# Patient Record
Sex: Female | Born: 1980 | Race: White | Hispanic: No | Marital: Married | Smoking: Current some day smoker
Health system: Southern US, Community
[De-identification: ages and names within clinical notes are randomized; demographics above are authoritative.]

## PROBLEM LIST (undated history)

## (undated) DIAGNOSIS — M7730 Calcaneal spur, unspecified foot: Secondary | ICD-10-CM

## (undated) DIAGNOSIS — M722 Plantar fascial fibromatosis: Secondary | ICD-10-CM

## (undated) HISTORY — PX: WRIST SURGERY: SHX841

## (undated) HISTORY — DX: Plantar fascial fibromatosis: M72.2

## (undated) HISTORY — DX: Calcaneal spur, unspecified foot: M77.30

---

## 2012-08-19 DIAGNOSIS — Z0000 Encounter for general adult medical examination without abnormal findings: Secondary | ICD-10-CM | POA: Insufficient documentation

## 2012-08-19 DIAGNOSIS — S46811A Strain of other muscles, fascia and tendons at shoulder and upper arm level, right arm, initial encounter: Secondary | ICD-10-CM | POA: Insufficient documentation

## 2015-05-12 ENCOUNTER — Telehealth: Payer: Self-pay | Admitting: Family Medicine

## 2015-05-12 NOTE — Telephone Encounter (Signed)
appt given per patients request 

## 2015-06-18 ENCOUNTER — Encounter: Payer: Self-pay | Admitting: Physician Assistant

## 2015-06-18 ENCOUNTER — Ambulatory Visit (INDEPENDENT_AMBULATORY_CARE_PROVIDER_SITE_OTHER): Payer: BLUE CROSS/BLUE SHIELD | Admitting: Physician Assistant

## 2015-06-18 VITALS — BP 113/78 | HR 77 | Temp 98.0°F | Ht 63.5 in | Wt 182.6 lb

## 2015-06-18 DIAGNOSIS — Z0000 Encounter for general adult medical examination without abnormal findings: Secondary | ICD-10-CM

## 2015-06-18 DIAGNOSIS — E669 Obesity, unspecified: Secondary | ICD-10-CM

## 2015-06-18 DIAGNOSIS — Z01419 Encounter for gynecological examination (general) (routine) without abnormal findings: Secondary | ICD-10-CM

## 2015-06-18 LAB — POCT UA - MICROSCOPIC ONLY
BACTERIA, U MICROSCOPIC: NEGATIVE
CASTS, UR, LPF, POC: NEGATIVE
CRYSTALS, UR, HPF, POC: NEGATIVE
RBC, urine, microscopic: NEGATIVE
YEAST UA: NEGATIVE

## 2015-06-18 LAB — POCT URINALYSIS DIPSTICK
Bilirubin, UA: NEGATIVE
Blood, UA: NEGATIVE
Glucose, UA: NEGATIVE
Ketones, UA: NEGATIVE
Leukocytes, UA: NEGATIVE
Nitrite, UA: NEGATIVE
Protein, UA: NEGATIVE
Spec Grav, UA: 1.015
Urobilinogen, UA: NEGATIVE
pH, UA: 6

## 2015-06-18 LAB — POCT CBC
Granulocyte percent: 65.6 %G (ref 37–80)
HCT, POC: 40.8 % (ref 37.7–47.9)
Hemoglobin: 13 g/dL (ref 12.2–16.2)
Lymph, poc: 2.6 (ref 0.6–3.4)
MCH, POC: 26.7 pg — AB (ref 27–31.2)
MCHC: 31.9 g/dL (ref 31.8–35.4)
MCV: 83.6 fL (ref 80–97)
MPV: 8.5 fL (ref 0–99.8)
POC Granulocyte: 6 (ref 2–6.9)
POC LYMPH PERCENT: 28.4 %L (ref 10–50)
Platelet Count, POC: 202 10*3/uL (ref 142–424)
RBC: 4.88 M/uL (ref 4.04–5.48)
RDW, POC: 12.8 %
WBC: 9.2 10*3/uL (ref 4.6–10.2)

## 2015-06-18 MED ORDER — PHENTERMINE HCL 37.5 MG PO CAPS: ORAL_CAPSULE | ORAL | Status: DC

## 2015-06-18 NOTE — Progress Notes (Signed)
   Subjective:    Patient ID: Laura Bowers, female    DOB: [birth year], 34 y.o.   MRN: [id number]  HPI 34 y/o female presents for establishment of care. She is utd on all of her vaccinations and tetanus. She request pap today and consult on weight loss. Last pap was 2 years ago. No h/o cervical dysplasia. No h/o stds  She also has questions regarding weight loss. She has tried phentermine in the past, which suppressed her appetite.     Review of Systems  Constitutional: Negative.   HENT: Negative.   Eyes: Negative.   Respiratory: Negative.   Cardiovascular: Negative.   Gastrointestinal: Negative.   Endocrine: Negative.   Genitourinary: Negative.   Musculoskeletal: Negative.   Skin: Negative.   Allergic/Immunologic: Negative.   Neurological: Negative.   Hematological: Negative.   Psychiatric/Behavioral: Negative.        Objective:   Physical Exam  Constitutional: She is oriented to person, place, and time. She appears well-developed and well-nourished. No distress.  Cardiovascular: Normal rate, regular rhythm, normal heart sounds and intact distal pulses.  Exam reveals no gallop and no friction rub.   No murmur heard. Pulmonary/Chest: Effort normal and breath sounds normal. No respiratory distress. She has no wheezes. She has no rales. She exhibits no tenderness.  Genitourinary: Vaginal discharge found.  Musculoskeletal: Normal range of motion. She exhibits no edema.  Neurological: She is alert and oriented to person, place, and time.  Skin: She is not diaphoretic.  Psychiatric: She has a normal mood and affect. Her behavior is normal. Judgment and thought content normal.  Nursing note and vitals reviewed.         Assessment & Plan:  1. Annual physical exam  - POCT UA - Microscopic Only - POCT urinalysis dipstick - POCT CBC - CMP14+EGFR - Vit D  25 hydroxy (rtn osteoporosis monitoring) - Thyroid Panel With TSH - Pap IG w/ reflex to HPV when ASC-U  2.  Obesity - Discussed consult with nutritionish  - phentermine 37.5 MG capsule; Take 1/2 to 1 tablet PO qam 30 min prior to breakfast  Dispense: 30 capsule; Refill: 2   Continue all meds Labs pending Health Maintenance reviewed Diet and exercise encouraged RTO 1 year   Cameran Pettey A. Benjamin Stain PA-C

## 2015-06-19 LAB — CMP14+EGFR
ALT: 11 IU/L (ref 0–32)
AST: 13 IU/L (ref 0–40)
Albumin/Globulin Ratio: 1.9 (ref 1.1–2.5)
Albumin: 4.3 g/dL (ref 3.5–5.5)
Alkaline Phosphatase: 50 IU/L (ref 39–117)
BUN/Creatinine Ratio: 19 (ref 8–20)
BUN: 13 mg/dL (ref 6–20)
Bilirubin Total: 0.5 mg/dL (ref 0.0–1.2)
CHLORIDE: 105 mmol/L (ref 97–108)
CO2: 24 mmol/L (ref 18–29)
CREATININE: 0.67 mg/dL (ref 0.57–1.00)
Calcium: 9.3 mg/dL (ref 8.7–10.2)
GFR calc Af Amer: 133 mL/min/{1.73_m2} (ref >59)
GFR calc non Af Amer: 115 mL/min/{1.73_m2} (ref >59)
GLOBULIN, TOTAL: 2.3 g/dL (ref 1.5–4.5)
Glucose: 75 mg/dL (ref 65–99)
Potassium: 4.4 mmol/L (ref 3.5–5.2)
Sodium: 143 mmol/L (ref 134–144)
Total Protein: 6.6 g/dL (ref 6.0–8.5)

## 2015-06-19 LAB — THYROID PANEL WITH TSH
Free Thyroxine Index: 2.5 (ref 1.2–4.9)
T3 Uptake Ratio: 32 % (ref 24–39)
T4 TOTAL: 7.7 ug/dL (ref 4.5–12.0)
TSH: 1.71 u[IU]/mL (ref 0.450–4.500)

## 2015-06-19 LAB — VITAMIN D 25 HYDROXY (VIT D DEFICIENCY, FRACTURES): VIT D 25 HYDROXY: 30.3 ng/mL (ref 30.0–100.0)

## 2015-06-22 LAB — PAP IG W/ RFLX HPV ASCU: PAP SMEAR COMMENT: 0

## 2015-06-23 ENCOUNTER — Telehealth: Payer: Self-pay | Admitting: *Deleted

## 2015-06-23 NOTE — Telephone Encounter (Signed)
-----   Message from Chelsea Primus, PA-C sent at 06/22/2015  3:37 PM EDT ----- All labs WNL with normal PAP. Repeat pap in 3 years Tiffany A. Chauncey Reading PA-C

## 2015-06-23 NOTE — Progress Notes (Signed)
Pt aware of lab pap results

## 2015-06-23 NOTE — Telephone Encounter (Signed)
lmtcb regarding test results. 

## 2016-11-24 ENCOUNTER — Encounter: Payer: Self-pay | Admitting: Family Medicine

## 2016-11-24 ENCOUNTER — Ambulatory Visit (INDEPENDENT_AMBULATORY_CARE_PROVIDER_SITE_OTHER): Payer: Managed Care, Other (non HMO) | Admitting: Family Medicine

## 2016-11-24 VITALS — BP 116/83 | HR 95 | Temp 98.4°F | Ht 63.5 in | Wt 177.0 lb

## 2016-11-24 DIAGNOSIS — M62838 Other muscle spasm: Secondary | ICD-10-CM

## 2016-11-24 DIAGNOSIS — F411 Generalized anxiety disorder: Secondary | ICD-10-CM

## 2016-11-24 MED ORDER — CYCLOBENZAPRINE HCL 10 MG PO TABS: 10.0000 mg | ORAL_TABLET | Freq: Three times a day (TID) | ORAL | 0 refills | Status: DC | PRN

## 2016-11-24 MED ORDER — ESCITALOPRAM OXALATE 10 MG PO TABS: 10.0000 mg | ORAL_TABLET | Freq: Every day | ORAL | 1 refills | Status: DC

## 2016-11-24 NOTE — Progress Notes (Signed)
BP 116/83   Pulse 95   Temp 98.4 F (36.9 C) (Oral)   Ht 5' 3.5" (1.613 m)   Wt 177 lb (80.3 kg)   LMP 11/20/2016   BMI 30.86 kg/m    Subjective:    Patient ID: Laura Bowers, female    DOB: 05-31-1981, 36 y.o.   MRN: [id number]  HPI: Roxane Puerto is a 36 y.o. female presenting on 11/24/2016 for right shoulder pain (wakes up feeling anxious)   HPI Right shoulder pain and neck tension Patient has been having increasingly frequent right shoulder tension and neck tension that has been more prevalent over the past couple months. She denies any numbness or weakness or recent trauma or falls prior to any of this starting up but she just says she came to getting more and more tense. She has been trying some heat helped some and then it does help to rest or sleep. 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 When she is off work for a couple days she starts to feel better. She denies any pain radiating anywhere else but the muscles on her trapezius between her neck and her shoulders.  Anxiety Patient comes in with feelings of anxiety that are building up over the last a while and she feels like that is what is waking her up multiple times through the night. She also feels like that may be attributing to her tension that she is having. She denies any feelings of depression or sadness or suicidal ideations. She does admit that it has started affect her appetite and sometimes she will get so shaky with anxiety that she will have tremors in her hands and palpitations and flutters. She does admit that she has always been a worrier and slightly more anxious than others around her. Most recently she has had a lot of difficulty because of the holiday break and family members that she's been missing.  Relevant past medical, surgical, family and social history reviewed  and updated as indicated. Interim medical history since our last visit reviewed. Allergies and medications reviewed and updated.  Review of Systems  Constitutional: Negative for chills and fever.  HENT: Negative for congestion, ear discharge and ear pain.   Eyes: Negative for redness and visual disturbance.  Respiratory: Negative for chest tightness and shortness of breath.   Cardiovascular: Negative for chest pain and leg swelling.  Genitourinary: Negative for difficulty urinating and dysuria.  Musculoskeletal: Positive for myalgias. Negative for back pain and gait problem.  Skin: Negative for rash.  Neurological: Negative for light-headedness and headaches.  Psychiatric/Behavioral: Positive for decreased concentration and sleep disturbance. Negative for agitation, behavioral problems, dysphoric mood, self-injury and suicidal ideas. The patient is nervous/anxious.   All other systems reviewed and are negative.   Per HPI unless specifically indicated above   Allergies as of 11/24/2016   No Known Allergies     Medication List       Accurate as of 11/24/16  4:24 PM. Always use your most recent med list.          cyclobenzaprine 10 MG tablet Commonly known as:  FLEXERIL Take 1 tablet (10 mg total) by mouth 3 (three) times daily as needed for muscle spasms.   escitalopram 10 MG tablet Commonly known as:  LEXAPRO Take 1 tablet (10 mg total)  by mouth daily.   phentermine 37.5 MG capsule Take 1/2 to 1 tablet PO qam 30 min prior to breakfast          Objective:    BP 116/83   Pulse 95   Temp 98.4 F (36.9 C) (Oral)   Ht 5' 3.5" (1.613 m)   Wt 177 lb (80.3 kg)   LMP 11/20/2016   BMI 30.86 kg/m   Wt Readings from Last 3 Encounters:  11/24/16 177 lb (80.3 kg)  06/18/15 182 lb 9.6 oz (82.8 kg)    Physical Exam  Constitutional: She is oriented to person, place, and time. She appears well-developed and well-nourished. No distress.  Eyes: Conjunctivae are normal.    Cardiovascular: Normal rate, regular rhythm, normal heart sounds and intact distal pulses.   No murmur heard. Pulmonary/Chest: Effort normal and breath sounds normal. No respiratory distress. She has no wheezes. She has no rales.  Musculoskeletal: Normal range of motion. She exhibits no edema.       Cervical back: She exhibits tenderness (Tenderness and tightness over both of her trapezius muscles, worse on right. No numbness or weakness or loss of range of motion. No pain in her neck or midline bony tenderness) and spasm. She exhibits normal range of motion, no bony tenderness and normal pulse.  Neurological: She is alert and oriented to person, place, and time. Coordination normal.  Skin: Skin is warm and dry. No rash noted. She is not diaphoretic.  Psychiatric: Her behavior is normal. Judgment and thought content normal. Her mood appears anxious. She does not exhibit a depressed mood. She expresses no suicidal ideation. She expresses no suicidal plans.  Nursing note and vitals reviewed.     Assessment & Plan:   Problem List Items Addressed This Visit    None    Visit Diagnoses    Generalized anxiety disorder    -  Primary   Relevant Medications   escitalopram (LEXAPRO) 10 MG tablet   Trapezius muscle spasm       Relevant Medications   cyclobenzaprine (FLEXERIL) 10 MG tablet       Follow up plan: Return in about 4 weeks (around 12/22/2016), or if symptoms worsen or fail to improve, for Recheck anxiety .  Counseling provided for all of the vaccine components No orders of the defined types were placed in this encounter.   Arville CareJoshua Konstantine Gervasi, MD [facility] 11/24/2016, 4:24 PM

## 2016-12-22 ENCOUNTER — Ambulatory Visit (INDEPENDENT_AMBULATORY_CARE_PROVIDER_SITE_OTHER): Payer: Managed Care, Other (non HMO) | Admitting: Family Medicine

## 2016-12-22 ENCOUNTER — Encounter: Payer: Self-pay | Admitting: Family Medicine

## 2016-12-22 VITALS — BP 114/77 | HR 73 | Temp 97.0°F | Ht 63.0 in | Wt 173.0 lb

## 2016-12-22 DIAGNOSIS — M25511 Pain in right shoulder: Secondary | ICD-10-CM

## 2016-12-22 DIAGNOSIS — G5621 Lesion of ulnar nerve, right upper limb: Secondary | ICD-10-CM | POA: Diagnosis not present

## 2016-12-22 DIAGNOSIS — F411 Generalized anxiety disorder: Secondary | ICD-10-CM | POA: Diagnosis not present

## 2016-12-22 MED ORDER — ESCITALOPRAM OXALATE 10 MG PO TABS: 10.0000 mg | ORAL_TABLET | Freq: Every day | ORAL | 1 refills | Status: DC

## 2016-12-22 MED ORDER — PREDNISONE 20 MG PO TABS: ORAL_TABLET | ORAL | 0 refills | Status: DC

## 2016-12-22 NOTE — Progress Notes (Signed)
BP 114/77   Pulse 73   Temp 97 F (36.1 C) (Oral)   Ht 5\' 3"  (1.6 m)   Wt 173 lb (78.5 kg)   BMI 30.65 kg/m    Subjective:    Patient ID: Laura Bowers, female    DOB: 09/07/1981, 36 y.o.   MRN: [id number]  HPI: Laura Bushyatasha Bowers is a 36 y.o. female presenting on 12/22/2016 for 4 week recheck amxiety   HPI Anxiety Patient is coming in to have a 4 week recheck of anxiety. She is currently on Lexapro 10 mg. She has been doing well on the medication denies any major issues. She denies any overwhelming feelings of anxiety. She denies any suicidal ideations or thoughts of hurting herself. She really feels like the medication is doing great for her.  Right shoulder pain with nerve pain and numbness going down into her hands. The pain in the right arm and numbness going down has been intermittent but something that she has been dealing with for a little while. She has used Flexeril which has helped some. She feels like it has not helped completely get rid of it. She says the pain is worse with overhead range of motion.  Relevant past medical, surgical, family and social history reviewed and updated as indicated. Interim medical history since our last visit reviewed. Allergies and medications reviewed and updated.  Review of Systems  Constitutional: Negative for chills and fever.  Respiratory: Negative for chest tightness and shortness of breath.   Cardiovascular: Negative for chest pain and leg swelling.  Genitourinary: Negative for difficulty urinating and dysuria.  Musculoskeletal: Positive for arthralgias. Negative for back pain and gait problem.  Skin: Negative for rash.  Neurological: Positive for numbness. Negative for weakness, light-headedness and headaches.  Psychiatric/Behavioral: Negative for agitation, behavioral problems, dysphoric mood, self-injury, sleep disturbance and suicidal ideas. The patient is nervous/anxious.   All other systems reviewed and are negative.   Per  HPI unless specifically indicated above     Objective:    BP 114/77   Pulse 73   Temp 97 F (36.1 C) (Oral)   Ht 5\' 3"  (1.6 m)   Wt 173 lb (78.5 kg)   BMI 30.65 kg/m   Wt Readings from Last 3 Encounters:  12/22/16 173 lb (78.5 kg)  11/24/16 177 lb (80.3 kg)  06/18/15 182 lb 9.6 oz (82.8 kg)    Physical Exam  Constitutional: She is oriented to person, place, and time. She appears well-developed and well-nourished. No distress.  Eyes: Conjunctivae are normal.  Neck: Neck supple. No thyromegaly present.  Cardiovascular: Normal rate, regular rhythm, normal heart sounds and intact distal pulses.   No murmur heard. Pulmonary/Chest: Effort normal and breath sounds normal. No respiratory distress. She has no wheezes. She has no rales.  Musculoskeletal: Normal range of motion. She exhibits tenderness. She exhibits no edema.       Right shoulder: She exhibits tenderness (Lateral tenderness, pain is worse with overhead range of motion.). She exhibits normal range of motion, no bony tenderness, no swelling, no deformity, normal pulse and normal strength.  Lymphadenopathy:    She has no cervical adenopathy.  Neurological: She is alert and oriented to person, place, and time. A sensory deficit (Right medial hand numbness.) is present. Coordination normal.  Skin: Skin is warm and dry. No rash noted. She is not diaphoretic.  Psychiatric: She has a normal mood and affect. Her behavior is normal.  Nursing note and vitals reviewed.  Assessment & Plan:   Problem List Items Addressed This Visit      Other   Generalized anxiety disorder - Primary   Relevant Medications   escitalopram (LEXAPRO) 10 MG tablet    Other Visit Diagnoses    Acute pain of right shoulder       Relevant Medications   predniSONE (DELTASONE) 20 MG tablet   Ulnar neuropathy of right upper extremity       Relevant Medications   escitalopram (LEXAPRO) 10 MG tablet   predniSONE (DELTASONE) 20 MG tablet        Follow up plan: Return if symptoms worsen or fail to improve.  Counseling provided for all of the vaccine components No orders of the defined types were placed in this encounter.   Arville Care, MD [facility] 12/22/2016, 4:45 PM

## 2017-01-15 ENCOUNTER — Encounter: Payer: Self-pay | Admitting: Family Medicine

## 2017-01-15 ENCOUNTER — Ambulatory Visit (INDEPENDENT_AMBULATORY_CARE_PROVIDER_SITE_OTHER): Payer: Managed Care, Other (non HMO) | Admitting: Family Medicine

## 2017-01-15 ENCOUNTER — Ambulatory Visit (INDEPENDENT_AMBULATORY_CARE_PROVIDER_SITE_OTHER): Payer: Managed Care, Other (non HMO)

## 2017-01-15 VITALS — BP 95/67 | HR 74 | Temp 98.3°F | Ht 63.0 in | Wt 175.0 lb

## 2017-01-15 DIAGNOSIS — Z7722 Contact with and (suspected) exposure to environmental tobacco smoke (acute) (chronic): Secondary | ICD-10-CM | POA: Diagnosis not present

## 2017-01-15 DIAGNOSIS — Z114 Encounter for screening for human immunodeficiency virus [HIV]: Secondary | ICD-10-CM

## 2017-01-15 DIAGNOSIS — Z0000 Encounter for general adult medical examination without abnormal findings: Secondary | ICD-10-CM

## 2017-01-15 DIAGNOSIS — M25511 Pain in right shoulder: Secondary | ICD-10-CM

## 2017-01-15 NOTE — Progress Notes (Signed)
BP 95/67   Pulse 74   Temp 98.3 F (36.8 C) (Oral)   Ht '5\' 3"'$  (1.6 m)   Wt 175 lb (79.4 kg)   BMI 31.00 kg/m    Subjective:    Patient ID: Laura Bowers, female    DOB: Sep 15, 1981, 36 y.o.   MRN: [id number]  HPI: Laura Bowers is a 36 y.o. female presenting on 01/15/2017 for Annual Exam (patient is fasting; Flexeril is not helping, shoulder felt better while on Prednisone but started hurting again as soon as she completed.  Waiting on referral still)   HPI Adult well exam and fasting labs Patient is coming in today for well adult exam and fasting labs. He denies any chest pain, shortness of breath, headaches or vision issues, abdominal complaints, diarrhea, nausea, vomiting, or joint issues besides her shoulder that's been bothering her for some time. Patient grew up with smokers and then has continued to smoke on her own and is just concerned because lung cancer run in her family. She would like to get a chest x-ray to see if it will be covered and see if anything is noted on the chest x-ray.  Right shoulder pain continued Patient has had right shoulder pain is been continued. She did not get a call for the orthopedic referral to was put into [place]. She has had continued shoulder pain. The prednisone did help but she feels like the Flexeril is not. The pain is still on the lateral aspect of her right neck radiating down into her right shoulder. She denies any numbness or weakness. She is still working a job where she looks down most of the time.  Relevant past medical, surgical, family and social history reviewed and updated as indicated. Interim medical history since our last visit reviewed. Allergies and medications reviewed and updated.  Review of Systems  Constitutional: Negative for chills and fever.  Respiratory: Negative for chest tightness and shortness of breath.   Cardiovascular: Negative for chest pain and leg swelling.  Genitourinary: Negative for difficulty  urinating and dysuria.  Musculoskeletal: Positive for arthralgias. Negative for back pain and gait problem.  Skin: Negative for rash.  Neurological: Positive for numbness. Negative for weakness, light-headedness and headaches.  Psychiatric/Behavioral: Negative for agitation, behavioral problems, dysphoric mood, self-injury, sleep disturbance and suicidal ideas. The patient is nervous/anxious.   All other systems reviewed and are negative.   Per HPI unless specifically indicated above     Objective:    BP 95/67   Pulse 74   Temp 98.3 F (36.8 C) (Oral)   Ht '5\' 3"'$  (1.6 m)   Wt 175 lb (79.4 kg)   BMI 31.00 kg/m   Wt Readings from Last 3 Encounters:  01/15/17 175 lb (79.4 kg)  12/22/16 173 lb (78.5 kg)  11/24/16 177 lb (80.3 kg)    Physical Exam  Constitutional: She is oriented to person, place, and time. She appears well-developed and well-nourished. No distress.  Eyes: Conjunctivae are normal.  Neck: Neck supple. No thyromegaly present.  Cardiovascular: Normal rate, regular rhythm, normal heart sounds and intact distal pulses.   No murmur heard. Pulmonary/Chest: Effort normal and breath sounds normal. No respiratory distress. She has no wheezes. She has no rales.  Musculoskeletal: Normal range of motion. She exhibits tenderness. She exhibits no edema.       Right shoulder: She exhibits tenderness (Lateral tenderness, pain is worse with overhead range of motion.). She exhibits normal range of motion, no bony tenderness, no swelling, no  deformity, normal pulse and normal strength.  Lymphadenopathy:    She has no cervical adenopathy.  Neurological: She is alert and oriented to person, place, and time. A sensory deficit (Right medial hand numbness.) is present. Coordination normal.  Skin: Skin is warm and dry. No rash noted. She is not diaphoretic.  Psychiatric: She has a normal mood and affect. Her behavior is normal.  Nursing note and vitals reviewed.     Assessment & Plan:    Problem List Items Addressed This Visit    None    Visit Diagnoses    Well adult exam    -  Primary   Relevant Orders   CMP14+EGFR (Completed)   Lipid panel (Completed)   CBC with Differential/Platelet (Completed)   DG Chest 2 View (Completed)   Acute pain of right shoulder       Relevant Orders   Ambulatory referral to Orthopedic Surgery   Secondhand smoke exposure       Grew up with grandparents, 17 years of smoking exposure, will do screening chest x-ray   Relevant Orders   DG Chest 2 View (Completed)   Screening for HIV without presence of risk factors       Relevant Orders   HIV antibody (Completed)       Follow up plan: Return in about 6 months (around 07/15/2017), or if symptoms worsen or fail to improve, for Recheck on Lexapro.  Counseling provided for all of the vaccine components Orders Placed This Encounter  Procedures  . DG Chest 2 View  . CMP14+EGFR  . Lipid panel  . CBC with Differential/Platelet  . HIV antibody  . Ambulatory referral to [place] Dettinger, MD [facility] 01/15/2017, 11:19 AM

## 2017-01-17 LAB — CMP14+EGFR
ALBUMIN: 4.2 g/dL (ref 3.5–5.5)
ALK PHOS: 49 IU/L (ref 39–117)
ALT: 15 IU/L (ref 0–32)
AST: 15 IU/L (ref 0–40)
Albumin/Globulin Ratio: 1.6 (ref 1.2–2.2)
BUN / CREAT RATIO: 14 (ref 9–23)
BUN: 10 mg/dL (ref 6–20)
Bilirubin Total: 0.5 mg/dL (ref 0.0–1.2)
CHLORIDE: 101 mmol/L (ref 96–106)
CO2: 25 mmol/L (ref 18–29)
CREATININE: 0.69 mg/dL (ref 0.57–1.00)
Calcium: 9.1 mg/dL (ref 8.7–10.2)
GFR calc Af Amer: 130 mL/min/{1.73_m2} (ref >59)
GFR calc non Af Amer: 112 mL/min/{1.73_m2} (ref >59)
GLUCOSE: 77 mg/dL (ref 65–99)
Globulin, Total: 2.6 g/dL (ref 1.5–4.5)
Potassium: 4.3 mmol/L (ref 3.5–5.2)
SODIUM: 140 mmol/L (ref 134–144)
Total Protein: 6.8 g/dL (ref 6.0–8.5)

## 2017-01-17 LAB — CBC WITH DIFFERENTIAL/PLATELET
BASOS: 0 %
Basophils Absolute: 0 10*3/uL (ref 0.0–0.2)
EOS (ABSOLUTE): 0.1 10*3/uL (ref 0.0–0.4)
EOS: 2 %
Hematocrit: 39.3 % (ref 34.0–46.6)
Hemoglobin: 12.4 g/dL (ref 11.1–15.9)
IMMATURE GRANS (ABS): 0 10*3/uL (ref 0.0–0.1)
Immature Granulocytes: 0 %
Lymphocytes Absolute: 2.3 10*3/uL (ref 0.7–3.1)
Lymphs: 28 %
MCH: 28.6 pg (ref 26.6–33.0)
MCHC: 31.6 g/dL (ref 31.5–35.7)
MCV: 91 fL (ref 79–97)
MONOCYTES: 4 %
Monocytes Absolute: 0.4 10*3/uL (ref 0.1–0.9)
NEUTROS PCT: 66 %
Neutrophils Absolute: 5.3 10*3/uL (ref 1.4–7.0)
PLATELETS: 277 10*3/uL (ref 150–379)
RBC: 4.34 x10E6/uL (ref 3.77–5.28)
RDW: 13.8 % (ref 12.3–15.4)
WBC: 8.2 10*3/uL (ref 3.4–10.8)

## 2017-01-17 LAB — LIPID PANEL
CHOLESTEROL TOTAL: 140 mg/dL (ref 100–199)
Chol/HDL Ratio: 3.6 ratio units (ref 0.0–4.4)
HDL: 39 mg/dL — ABNORMAL LOW (ref >39)
LDL Calculated: 83 mg/dL (ref 0–99)
Triglycerides: 91 mg/dL (ref 0–149)
VLDL Cholesterol Cal: 18 mg/dL (ref 5–40)

## 2017-01-17 LAB — HIV ANTIBODY (ROUTINE TESTING W REFLEX): HIV SCREEN 4TH GENERATION: NONREACTIVE

## 2017-03-16 ENCOUNTER — Other Ambulatory Visit: Payer: Self-pay | Admitting: Family Medicine

## 2017-03-16 DIAGNOSIS — F411 Generalized anxiety disorder: Secondary | ICD-10-CM

## 2017-03-16 MED ORDER — ESCITALOPRAM OXALATE 10 MG PO TABS: 10.0000 mg | ORAL_TABLET | Freq: Every day | ORAL | 0 refills | Status: DC

## 2017-03-16 NOTE — Telephone Encounter (Signed)
What is the name of the medication? Lexapro  Have you contacted your pharmacy to request a refill? YES  Which pharmacy would you like this sent to? [facility] in [place]   Patient notified that their request is being sent to the clinical staff for review and that they should receive a call once it is complete. If they do not receive a call within 24 hours they can check with their pharmacy or our office.

## 2017-03-16 NOTE — Telephone Encounter (Signed)
done

## 2017-03-22 ENCOUNTER — Ambulatory Visit: Payer: Managed Care, Other (non HMO) | Admitting: Family Medicine

## 2017-03-23 ENCOUNTER — Telehealth: Payer: Self-pay | Admitting: Family Medicine

## 2017-03-23 ENCOUNTER — Encounter: Payer: Self-pay | Admitting: Family Medicine

## 2017-03-29 NOTE — Telephone Encounter (Signed)
Voicemail full jkp 5/10

## 2017-06-21 ENCOUNTER — Encounter: Payer: Self-pay | Admitting: Family

## 2017-06-21 ENCOUNTER — Ambulatory Visit (INDEPENDENT_AMBULATORY_CARE_PROVIDER_SITE_OTHER): Payer: Managed Care, Other (non HMO) | Admitting: Family

## 2017-06-21 VITALS — BP 108/71 | HR 73 | Temp 97.2°F | Ht 63.0 in | Wt 178.8 lb

## 2017-06-21 DIAGNOSIS — M25511 Pain in right shoulder: Secondary | ICD-10-CM

## 2017-06-21 NOTE — Patient Instructions (Signed)
Shoulder Pain Many things can cause shoulder pain, including:  An injury to the area.  Overuse of the shoulder.  Arthritis. The source of the pain can be:  Inflammation.  An injury to the shoulder joint.  An injury to a tendon, ligament, or bone. Follow these instructions at home: Take these actions to help with your pain:  Squeeze a soft ball or a foam pad as much as possible. This helps to keep the shoulder from swelling. It also helps to strengthen the arm.  Take over-the-counter and prescription medicines only as told by your health care provider.  If directed, apply ice to the area:  Put ice in a plastic bag.  Place a towel between your skin and the bag.  Leave the ice on for 20 minutes, 2-3 times per day. Stop applying ice if it does not help with the pain.  If you were given a shoulder sling or immobilizer:  Wear it as told.  Remove it to shower or bathe.  Move your arm as little as possible, but keep your hand moving to prevent swelling. Contact a health care provider if:  Your pain gets worse.  Your pain is not relieved with medicines.  New pain develops in your arm, hand, or fingers. Get help right away if:  Your arm, hand, or fingers:  Tingle.  Become numb.  Become swollen.  Become painful.  Turn white or blue. This information is not intended to replace advice given to you by your health care provider. Make sure you discuss any questions you have with your health care provider. Document Released: 08/16/2005 Document Revised: 07/02/2016 Document Reviewed: 03/01/2015 Elsevier Interactive Patient Education  2017 [employer] Inc[employer]  

## 2017-06-21 NOTE — Progress Notes (Signed)
   Subjective:    Patient ID: Laura Bowers, female    DOB: 08/20/1981, 36 y.o.   MRN: [id number]  Shoulder Pain   The pain is present in the right shoulder and left shoulder. This is a chronic problem. The current episode started more than 1 month ago. There has been no history of extremity trauma. The problem occurs constantly. The problem has been gradually worsening. The quality of the pain is described as burning. The pain is at a severity of 9/10. The pain is moderate. Associated symptoms include an inability to bear weight, a limited range of motion, numbness and tingling. The symptoms are aggravated by activity. She has tried NSAIDS and rest for the symptoms. The treatment provided no relief.      Review of Systems  Musculoskeletal: Positive for myalgias.       Right shoulder pain   Neurological: Positive for tingling and numbness.  All other systems reviewed and are negative.      Objective:   Physical Exam  Constitutional: She is oriented to person, place, and time. She appears well-developed and well-nourished. No distress.  HENT:  Head: Normocephalic.  Neck: Normal range of motion. Neck supple. No thyromegaly present.  Cardiovascular: Normal rate, regular rhythm, normal heart sounds and intact distal pulses.   No murmur heard. Pulmonary/Chest: Effort normal and breath sounds normal. No respiratory distress. She has no wheezes.  Abdominal: Soft. Bowel sounds are normal. She exhibits no distension. There is no tenderness.  Musculoskeletal: She exhibits tenderness. She exhibits no edema.  Limited ROM of right arm with abduction, tenderness in lateral right neck radiating down right arm  Neurological: She is alert and oriented to person, place, and time.  Skin: Skin is warm and dry.  Psychiatric: She has a normal mood and affect. Her behavior is normal. Judgment and thought content normal.  Vitals reviewed.   BP 108/71   Pulse 73   Temp (!) 97.2 F (36.2 C) (Oral)    Ht 5\' 3"  (1.6 m)   Wt 178 lb 12.8 oz (81.1 kg)   BMI 31.67 kg/m      Assessment & Plan:  1. Acute pain of right shoulder Pt refuses any steroids, NSAID's, or muscle relaxer's today- States this "just covers up the problem" Rest Ice  ROM exercises encouraged  Got pt Ortho appt on 06/29/17 and pt is aware of date and time RTO prn  - Ambulatory referral to Orthopedic Surgery   Jannifer Rodneyhristy Deane Wattenbarger, FNP

## 2017-07-16 ENCOUNTER — Ambulatory Visit: Payer: Managed Care, Other (non HMO) | Admitting: Family Medicine

## 2017-07-17 ENCOUNTER — Telehealth: Payer: Self-pay | Admitting: Family Medicine

## 2017-07-17 ENCOUNTER — Encounter: Payer: Self-pay | Admitting: Family Medicine

## 2017-07-17 NOTE — Telephone Encounter (Signed)
No answer, voicemail is full.

## 2017-11-07 ENCOUNTER — Ambulatory Visit (INDEPENDENT_AMBULATORY_CARE_PROVIDER_SITE_OTHER): Payer: Managed Care, Other (non HMO) | Admitting: Family Medicine

## 2017-11-07 ENCOUNTER — Encounter: Payer: Self-pay | Admitting: Family Medicine

## 2017-11-07 VITALS — BP 112/73 | HR 74 | Temp 98.5°F | Ht 63.0 in | Wt 182.0 lb

## 2017-11-07 DIAGNOSIS — R1011 Right upper quadrant pain: Secondary | ICD-10-CM

## 2017-11-07 DIAGNOSIS — E669 Obesity, unspecified: Secondary | ICD-10-CM | POA: Diagnosis not present

## 2017-11-07 MED ORDER — PHENTERMINE HCL 37.5 MG PO CAPS: ORAL_CAPSULE | ORAL | 2 refills | Status: DC

## 2017-11-07 NOTE — Progress Notes (Signed)
BP 112/73   Pulse 74   Temp 98.5 F (36.9 C) (Oral)   Ht 5\' 3"  (1.6 m)   Wt 182 lb (82.6 kg)   BMI 32.24 kg/m    Subjective:    Patient ID: Laura Bowers, female    DOB: 02/28/1981, 36 y.o.   MRN: [id number]  HPI: Laura Bowers is a 36 y.o. female presenting on 11/07/2017 for Abdominal Pain (x 2 months, severe pain begins after eating, unable to determine what foods actually causing it) and Medication Refill (Phenterimine)   HPI Abdominal pain Patient is coming in complaining of epigastric/right upper quadrant abdominal pain that has been going on for the past 2 months and has been worsening and increasing in frequency.  She says she notices the pain begins after eating certain foods but she cannot pinpoint what foods.  She does notice it more in the evening after dinnertime then during the day.  She says the pain will come on and will be severe and doubling over pain that will take a couple hours to improve.  It does end up improving and going away.  She does not off the pain currently in office today.  She has tried to adjust her diet to see if anything could help but has not been able to pinpoint anything specific.  Obesity and weight loss medication refill Patient has gained a few more pounds would like to go back on her phentermine that she is been off for quite some time.  Relevant past medical, surgical, family and social history reviewed and updated as indicated. Interim medical history since our last visit reviewed. Allergies and medications reviewed and updated.  Review of Systems  Constitutional: Negative for chills and fever.  Eyes: Negative for visual disturbance.  Respiratory: Negative for chest tightness and shortness of breath.   Cardiovascular: Negative for chest pain and leg swelling.  Gastrointestinal: Positive for abdominal pain. Negative for constipation, diarrhea, nausea and vomiting.  Genitourinary: Negative for dysuria, flank pain and frequency.    Musculoskeletal: Negative for back pain and gait problem.  Skin: Negative for rash.  Neurological: Negative for light-headedness and headaches.  Psychiatric/Behavioral: Negative for agitation and behavioral problems.  All other systems reviewed and are negative.   Per HPI unless specifically indicated above     Objective:    BP 112/73   Pulse 74   Temp 98.5 F (36.9 C) (Oral)   Ht 5\' 3"  (1.6 m)   Wt 182 lb (82.6 kg)   BMI 32.24 kg/m   Wt Readings from Last 3 Encounters:  11/07/17 182 lb (82.6 kg)  06/21/17 178 lb 12.8 oz (81.1 kg)  01/15/17 175 lb (79.4 kg)    Physical Exam  Constitutional: She is oriented to person, place, and time. She appears well-developed and well-nourished. No distress.  Eyes: Conjunctivae are normal.  Cardiovascular: Normal rate, regular rhythm, normal heart sounds and intact distal pulses.  No murmur heard. Pulmonary/Chest: Effort normal and breath sounds normal. No respiratory distress. She has no wheezes. She has no rales.  Abdominal: Soft. Bowel sounds are normal. She exhibits no distension. There is no hepatosplenomegaly. There is tenderness (Positive Murphy sign) in the right upper quadrant and epigastric area. There is no rebound, no guarding and no CVA tenderness.  Musculoskeletal: Normal range of motion. She exhibits no edema.  Neurological: She is alert and oriented to person, place, and time. Coordination normal.  Skin: Skin is warm and dry. No rash noted. She is not diaphoretic.  Psychiatric: She has a normal mood and affect. Her behavior is normal.  Nursing note and vitals reviewed.       Assessment & Plan:   Problem List Items Addressed This Visit    None    Visit Diagnoses    Right upper quadrant abdominal pain    -  Primary   Relevant Orders   US Abdomen Limited RUQ (Completed)   Obesity       Relevant Medications   phentermine 37.5 MG capsule       Follow up plan: Return in about 4 weeks (around 12/05/2017), or if  symptoms worsen or fail to improve, for Weight recheck.  Counseling provided for all of the vaccine components Orders Placed This Encounter  Procedures  . US Abdomen Limited RUQ    Arville CareJoshua Layni Kreamer, MD [facility] 11/07/2017, 4:45 PM

## 2017-11-08 ENCOUNTER — Telehealth: Payer: Self-pay | Admitting: Family Medicine

## 2017-11-08 ENCOUNTER — Ambulatory Visit (HOSPITAL_COMMUNITY)
Admission: RE | Admit: 2017-11-08 | Discharge: 2017-11-08 | Disposition: A | Discharge status: Home / Self Care | Payer: Managed Care, Other (non HMO) | Source: Ambulatory Visit | Attending: Family Medicine | Admitting: Family Medicine

## 2017-11-08 DIAGNOSIS — R1011 Right upper quadrant pain: Secondary | ICD-10-CM | POA: Insufficient documentation

## 2017-11-08 NOTE — Telephone Encounter (Signed)
Patient was called and informed of radiology results and she will try omeprazole 20 mg q. before meals a.m. and let us know if she still has issues and consider HIDA scan in the future.

## 2017-12-03 ENCOUNTER — Ambulatory Visit: Payer: Managed Care, Other (non HMO) | Admitting: Family Medicine

## 2017-12-05 ENCOUNTER — Encounter: Payer: Self-pay | Admitting: Family Medicine

## 2019-03-17 ENCOUNTER — Other Ambulatory Visit: Payer: Self-pay | Admitting: Podiatry

## 2019-03-17 ENCOUNTER — Ambulatory Visit (INDEPENDENT_AMBULATORY_CARE_PROVIDER_SITE_OTHER): Payer: 59

## 2019-03-17 ENCOUNTER — Ambulatory Visit (INDEPENDENT_AMBULATORY_CARE_PROVIDER_SITE_OTHER): Payer: 59 | Admitting: Podiatry

## 2019-03-17 ENCOUNTER — Other Ambulatory Visit: Payer: Self-pay

## 2019-03-17 ENCOUNTER — Encounter: Payer: Self-pay | Admitting: Podiatry

## 2019-03-17 VITALS — BP 112/79 | HR 74 | Temp 98.1°F | Resp 16

## 2019-03-17 DIAGNOSIS — M722 Plantar fascial fibromatosis: Secondary | ICD-10-CM

## 2019-03-17 DIAGNOSIS — M79672 Pain in left foot: Secondary | ICD-10-CM

## 2019-03-17 DIAGNOSIS — M79671 Pain in right foot: Secondary | ICD-10-CM

## 2019-03-17 NOTE — Patient Instructions (Signed)
Plantar Fasciitis (Heel Spur Syndrome) with Rehab The plantar fascia is a fibrous, ligament-like, soft-tissue structure that spans the bottom of the foot. Plantar fasciitis is a condition that causes pain in the foot due to inflammation of the tissue. SYMPTOMS   Pain and tenderness on the underneath side of the foot.  Pain that worsens with standing or walking. CAUSES  Plantar fasciitis is caused by irritation and injury to the plantar fascia on the underneath side of the foot. Common mechanisms of injury include:  Direct trauma to bottom of the foot.  Damage to a small nerve that runs under the foot where the main fascia attaches to the heel bone.  Stress placed on the plantar fascia due to bone spurs. RISK INCREASES WITH:   Activities that place stress on the plantar fascia (running, jumping, pivoting, or cutting).  Poor strength and flexibility.  Improperly fitted shoes.  Tight calf muscles.  Flat feet.  Failure to warm-up properly before activity.  Obesity. PREVENTION  Warm up and stretch properly before activity.  Allow for adequate recovery between workouts.  Maintain physical fitness:  Strength, flexibility, and endurance.  Cardiovascular fitness.  Maintain a health body weight.  Avoid stress on the plantar fascia.  Wear properly fitted shoes, including arch supports for individuals who have flat feet.  PROGNOSIS  If treated properly, then the symptoms of plantar fasciitis usually resolve without surgery. However, occasionally surgery is necessary.  RELATED COMPLICATIONS   Recurrent symptoms that may result in a chronic condition.  Problems of the lower back that are caused by compensating for the injury, such as limping.  Pain or weakness of the foot during push-off following surgery.  Chronic inflammation, scarring, and partial or complete fascia tear, occurring more often from repeated injections.  TREATMENT  Treatment initially involves the  use of ice and medication to help reduce pain and inflammation. The use of strengthening and stretching exercises may help reduce pain with activity, especially stretches of the Achilles tendon. These exercises may be performed at home or with a therapist. Your caregiver may recommend that you use heel cups of arch supports to help reduce stress on the plantar fascia. Occasionally, corticosteroid injections are given to reduce inflammation. If symptoms persist for greater than 6 months despite non-surgical (conservative), then surgery may be recommended.   MEDICATION   If pain medication is necessary, then nonsteroidal anti-inflammatory medications, such as aspirin and ibuprofen, or other minor pain relievers, such as acetaminophen, are often recommended.  Do not take pain medication within 7 days before surgery.  Prescription pain relievers may be given if deemed necessary by your caregiver. Use only as directed and only as much as you need.  Corticosteroid injections may be given by your caregiver. These injections should be reserved for the most serious cases, because they may only be given a certain number of times.  HEAT AND COLD  Cold treatment (icing) relieves pain and reduces inflammation. Cold treatment should be applied for 10 to 15 minutes every 2 to 3 hours for inflammation and pain and immediately after any activity that aggravates your symptoms. Use ice packs or massage the area with a piece of ice (ice massage).  Heat treatment may be used prior to performing the stretching and strengthening activities prescribed by your caregiver, physical therapist, or athletic trainer. Use a heat pack or soak the injury in warm water.  SEEK IMMEDIATE MEDICAL CARE IF:  Treatment seems to offer no benefit, or the condition worsens.  Any medications   produce adverse side effects.  EXERCISES- RANGE OF MOTION (ROM) AND STRETCHING EXERCISES - Plantar Fasciitis (Heel Spur Syndrome) These exercises  may help you when beginning to rehabilitate your injury. Your symptoms may resolve with or without further involvement from your physician, physical therapist or athletic trainer. While completing these exercises, remember:   Restoring tissue flexibility helps normal motion to return to the joints. This allows healthier, less painful movement and activity.  An effective stretch should be held for at least 30 seconds.  A stretch should never be painful. You should only feel a gentle lengthening or release in the stretched tissue.  RANGE OF MOTION - Toe Extension, Flexion  Sit with your right / left leg crossed over your opposite knee.  Grasp your toes and gently pull them back toward the top of your foot. You should feel a stretch on the bottom of your toes and/or foot.  Hold this stretch for 10 seconds.  Now, gently pull your toes toward the bottom of your foot. You should feel a stretch on the top of your toes and or foot.  Hold this stretch for 10 seconds. Repeat  times. Complete this stretch 3 times per day.   RANGE OF MOTION - Ankle Dorsiflexion, Active Assisted  Remove shoes and sit on a chair that is preferably not on a carpeted surface.  Place right / left foot under knee. Extend your opposite leg for support.  Keeping your heel down, slide your right / left foot back toward the chair until you feel a stretch at your ankle or calf. If you do not feel a stretch, slide your bottom forward to the edge of the chair, while still keeping your heel down.  Hold this stretch for 10 seconds. Repeat 3 times. Complete this stretch 2 times per day.   STRETCH  Gastroc, Standing  Place hands on wall.  Extend right / left leg, keeping the front knee somewhat bent.  Slightly point your toes inward on your back foot.  Keeping your right / left heel on the floor and your knee straight, shift your weight toward the wall, not allowing your back to arch.  You should feel a gentle stretch  in the right / left calf. Hold this position for 10 seconds. Repeat 3 times. Complete this stretch 2 times per day.  STRETCH  Soleus, Standing  Place hands on wall.  Extend right / left leg, keeping the other knee somewhat bent.  Slightly point your toes inward on your back foot.  Keep your right / left heel on the floor, bend your back knee, and slightly shift your weight over the back leg so that you feel a gentle stretch deep in your back calf.  Hold this position for 10 seconds. Repeat 3 times. Complete this stretch 2 times per day.  STRETCH  Gastrocsoleus, Standing  Note: This exercise can place a lot of stress on your foot and ankle. Please complete this exercise only if specifically instructed by your caregiver.   Place the ball of your right / left foot on a step, keeping your other foot firmly on the same step.  Hold on to the wall or a rail for balance.  Slowly lift your other foot, allowing your body weight to press your heel down over the edge of the step.  You should feel a stretch in your right / left calf.  Hold this position for 10 seconds.  Repeat this exercise with a slight bend in your right /   left knee. Repeat 3 times. Complete this stretch 2 times per day.   STRENGTHENING EXERCISES - Plantar Fasciitis (Heel Spur Syndrome)  These exercises may help you when beginning to rehabilitate your injury. They may resolve your symptoms with or without further involvement from your physician, physical therapist or athletic trainer. While completing these exercises, remember:   Muscles can gain both the endurance and the strength needed for everyday activities through controlled exercises.  Complete these exercises as instructed by your physician, physical therapist or athletic trainer. Progress the resistance and repetitions only as guided.  STRENGTH - Towel Curls  Sit in a chair positioned on a non-carpeted surface.  Place your foot on a towel, keeping your heel  on the floor.  Pull the towel toward your heel by only curling your toes. Keep your heel on the floor. Repeat 3 times. Complete this exercise 2 times per day.  STRENGTH - Ankle Inversion  Secure one end of a rubber exercise band/tubing to a fixed object (table, pole). Loop the other end around your foot just before your toes.  Place your fists between your knees. This will focus your strengthening at your ankle.  Slowly, pull your big toe up and in, making sure the band/tubing is positioned to resist the entire motion.  Hold this position for 10 seconds.  Have your muscles resist the band/tubing as it slowly pulls your foot back to the starting position. Repeat 3 times. Complete this exercises 2 times per day.  Document Released: 11/06/2005 Document Revised: 01/29/2012 Document Reviewed: 02/18/2009 [employer] Patient Information 2014 [employer].   Pre-Operative Instructions  Congratulations, you have decided to take an important step towards improving your quality of life.  You can be assured that the doctors and staff at [facility] will be with you every step of the way.  Here are some important things you should know:  1. Plan to be at the surgery center/hospital at least 1 (one) hour prior to your scheduled time, unless otherwise directed by the surgical center/hospital staff.  You must have a responsible adult accompany you, remain during the surgery and drive you home.  Make sure you have directions to the surgical center/hospital to ensure you arrive on time. 2. If you are having surgery at [facility] or [facility], you will need a copy of your medical history and physical form from your family physician within one month prior to the date of surgery. We will give you a form for your primary physician to complete.  3. We make every effort to accommodate the date you request for surgery.  However, there are times where surgery dates or times have to be moved.  We  will contact you as soon as possible if a change in schedule is required.   4. No aspirin/ibuprofen for one week before surgery.  If you are on aspirin, any non-steroidal anti-inflammatory medications (Mobic, Aleve, Ibuprofen) should not be taken seven (7) days prior to your surgery.  You make take Tylenol for pain prior to surgery.  5. Medications - If you are taking daily heart and blood pressure medications, seizure, reflux, allergy, asthma, anxiety, pain or diabetes medications, make sure you notify the surgery center/hospital before the day of surgery so they can tell you which medications you should take or avoid the day of surgery. 6. No food or drink after midnight the night before surgery unless directed otherwise by surgical center/hospital staff. 7. No alcoholic beverages 24-hours prior to surgery.    No smoking 24-hours prior or 24-hours after surgery. 8. Wear loose pants or shorts. They should be loose enough to fit over bandages, boots, and casts. 9. Don't wear slip-on shoes. Sneakers are preferred. 10. Bring your boot with you to the surgery center/hospital.  Also bring crutches or a walker if your physician has prescribed it for you.  If you do not have this equipment, it will be provided for you after surgery. 11. If you have not been contacted by the surgery center/hospital by the day before your surgery, call to confirm the date and time of your surgery. 12. Leave-time from work may vary depending on the type of surgery you have.  Appropriate arrangements should be made prior to surgery with your employer. 13. Prescriptions will be provided immediately following surgery by your doctor.  Fill these as soon as possible after surgery and take the medication as directed. Pain medications will not be refilled on weekends and must be approved by the doctor. 14. Remove nail polish on the operative foot and avoid getting pedicures prior to surgery. 15. Wash the night before surgery.  The night  before surgery wash the foot and leg well with water and the antibacterial soap provided. Be sure to pay special attention to beneath the toenails and in between the toes.  Wash for at least three (3) minutes. Rinse thoroughly with water and dry well with a towel.  Perform this wash unless told not to do so by your physician.  Enclosed: 1 Ice pack (please put in freezer the night before surgery)   1 Hibiclens skin cleaner   Pre-op instructions  If you have any questions regarding the instructions, please do not hesitate to call our office.  [place]: [address], [place], [place] [address] -- [phone]  [place]: [address] [place], [place] [address] -- [phone]  [place]: [address]  [place], [place] [address] -- [phone]  [place]: [address], [address], [place], [place] [address] -- [phone]  Website: [email or web address] 

## 2019-03-17 NOTE — Progress Notes (Signed)
   Subjective:    Patient ID: Laura Bowers, female    DOB: [birth year], 38 y.o.   MRN: [id number]  HPI    Review of Systems  All other systems reviewed and are negative.      Objective:   Physical Exam        Assessment & Plan:

## 2019-03-19 NOTE — Progress Notes (Signed)
Subjective:   Patient ID: Laura Bowers, female   DOB: 38 y.o.   MRN: [id number]   HPI Patient presents stating she has had a lot of pain in the bottom of her left heel that is been going on for several years and she is had numerous injections in the past which only gave temporary relief of symptoms and it is increasingly hard for her to be active and she is walking differently and is developing pain in other parts of her foot also and into her right foot.  States it reached a point where she simply cannot be active or perform any types of issues.  Patient does not smoke and likes to be active    Review of Systems  All other systems reviewed and are negative.       Objective:  Physical Exam Vitals signs and nursing note reviewed.  Constitutional:      Appearance: She is well-developed.  Pulmonary:     Effort: Pulmonary effort is normal.  Musculoskeletal: Normal range of motion.  Skin:    General: Skin is warm.  Neurological:     Mental Status: She is alert.   Neurovascular status found to be intact muscle strength was adequate with patient found to have significant inflammation pain at the insertion of the plantar fascia into the calcaneus.  There is quite a bit of pain around this area and it is hard for her to walk on it and she is developing compensation-like symptoms with pain in the outside of the foot and within the arch itself.  Patient has good digital perfusion well oriented x3 and also has right foot pain from walking differently     Assessment:  Chronic plantar fasciitis but not responded any form of conservative care with inflammation pain at the insertion of the tendon into the calcaneus with compensatory pains also noted     Plan:  H&P conditions reviewed and discussed.  I reviewed her x-rays and discussed treatment options and she states that she is managed in any form of conservative treatment there is nothing is worked in the past and she is had injections  immobilization and anti-inflammatory physical therapy and she would like to try to have it corrected and I have recommended endoscopic release of the fascia.  I did discuss at great length procedure and risk and the fact there is no long-term guarantees this will solve her problem and she is willing to accept this risk and wants to do consent form today.  I allowed her to read consent form and after she read it she signed consent form and is given all preoperative instructions and had air fracture walker dispensed with all instructions on usage and she is scheduled for outpatient surgery in the next couple weeks understanding total recovery can take 6 months to 1 year.  Patient is encouraged to call us with any questions she may have  X-rays indicate some change around the calcaneal insertion left with no indication stress fracture arthritis left or right

## 2022-04-02 ENCOUNTER — Encounter: Payer: Self-pay | Admitting: Emergency Medicine

## 2022-04-02 ENCOUNTER — Emergency Department (INDEPENDENT_AMBULATORY_CARE_PROVIDER_SITE_OTHER)
Admission: EM | Admit: 2022-04-02 | Discharge: 2022-04-02 | Disposition: A | Discharge status: Home / Self Care | Payer: BC Managed Care – PPO | Source: Home / Self Care | Attending: Family Medicine | Admitting: Family Medicine

## 2022-04-02 DIAGNOSIS — L03113 Cellulitis of right upper limb: Secondary | ICD-10-CM | POA: Diagnosis not present

## 2022-04-02 DIAGNOSIS — L237 Allergic contact dermatitis due to plants, except food: Secondary | ICD-10-CM

## 2022-04-02 MED ORDER — PREDNISONE 20 MG PO TABS: ORAL_TABLET | ORAL | 0 refills | Status: DC

## 2022-04-02 MED ORDER — DOXYCYCLINE HYCLATE 100 MG PO CAPS: ORAL_CAPSULE | ORAL | 0 refills | Status: DC

## 2022-04-02 MED ORDER — METHYLPREDNISOLONE SODIUM SUCC 125 MG IJ SOLR
80.0000 mg | Freq: Once | INTRAMUSCULAR | Status: AC
  Administered 2022-04-02: 80 mg via INTRAMUSCULAR

## 2022-04-02 NOTE — Discharge Instructions (Addendum)
Begin prednisone Monday 04/03/22. ?May take Zyrtec for itching during the day. ?May continue to apply calamine lotion to weeping areas. ?

## 2022-04-02 NOTE — ED Provider Notes (Signed)
?[facility] ? ? ? ?CSN: [id number] ?Arrival date & time: 04/02/22  0820 ? ? ?  ? ?History   ?Chief Complaint ?Chief Complaint  ?Patient presents with  ? Rash  ? ? ?HPI ?Laura Bowers is a 41 y.o. female.  ? ?Patient was clearing land six days ago when she contacted poison oak.  She subsequently developed a pruritic rash on her lower legs, arms, and neck.  She just finished a medrol dosepak without improvement.  The rash on her right forearm has become increasingly warm, erythematous and uncomfortable. ? ?The history is provided by the patient and the spouse.  ?Poison Ivy ?This is a new problem. Episode onset: 6 days ago. The problem occurs constantly. The problem has been gradually worsening. Associated symptoms comments: No fever. Nothing aggravates the symptoms. Nothing relieves the symptoms. Treatments tried: calamine lotion.  ? ?History reviewed. No pertinent past medical history. ? ?There are no problems to display for this patient. ? ? ?Past Surgical History:  ?Procedure Laterality Date  ? WRIST SURGERY Left   ? ? ?OB History   ?No obstetric history on file. ?  ? ? ? ?Home Medications   ? ?Prior to Admission medications   ?Medication Sig Start Date End Date Taking? Authorizing Provider  ?doxycycline (VIBRAMYCIN) 100 MG capsule Take one cap PO Q12hr with food. 04/02/22  Yes Lattie Haw, MD  ?predniSONE (DELTASONE) 20 MG tablet Take one tab by mouth twice daily for 4 days, then one daily for 3 days. Take with food. 04/02/22  Yes Lattie Haw, MD  ?omeprazole (PRILOSEC) 20 MG capsule Take 20 mg by mouth daily. 02/20/22   [provider]  ?WEGOVY 0.5 MG/0.5ML SOAJ Inject 0.5 mg into the skin once a week. 03/22/22   [provider]  ? ? ?Family History ?Family History  ?Problem Relation Age of Onset  ? Healthy Mother   ? Cirrhosis Father   ? ? ?Social History ?Social History  ? ?Tobacco Use  ? Smoking status: Some Days  ?  Types: Cigars  ? Smokeless tobacco: Never  ?Vaping Use  ?  Vaping Use: Never used  ?Substance Use Topics  ? Alcohol use: Not Currently  ? Drug use: Never  ? ? ? ?Allergies   ?Patient has no known allergies. ? ? ?Review of Systems ?Review of Systems  ?Constitutional:  Negative for chills, diaphoresis, fatigue and fever.  ?Skin:  Positive for color change and rash.  ?All other systems reviewed and are negative. ? ? ?Physical Exam ?Triage Vital Signs ?ED Triage Vitals  ?Enc Vitals Group  ?   BP 04/02/22 0902 111/76  ?   Pulse Rate 04/02/22 0902 75  ?   Resp 04/02/22 0902 17  ?   Temp 04/02/22 0902 98.3 ?F (36.8 ?C)  ?   Temp Source 04/02/22 0902 Oral  ?   SpO2 04/02/22 0902 99 %  ?   Weight 04/02/22 0908 172 lb (78 kg)  ?   Height 04/02/22 0908 5\' 4"  (1.626 m)  ?   Head Circumference --   ?   Peak Flow --   ?   Pain Score 04/02/22 0906 4  ?   Pain Loc --   ?   Pain Edu? --   ?   Excl. in GC? --   ? ?No data found. ? ?Updated Vital Signs ?BP 111/76 (BP Location: Left Arm)   Pulse 75   Temp 98.3 ?F (36.8 ?C) (Oral)   Resp  17   Ht 5\' 4"  (1.626 m)   Wt 78 kg   LMP 03/26/2022 (Exact Date)   SpO2 99%   BMI 29.52 kg/m?  ? ?Visual Acuity ?Right Eye Distance:   ?Left Eye Distance:   ?Bilateral Distance:   ? ?Right Eye Near:   ?Left Eye Near:    ?Bilateral Near:    ? ?Physical Exam ?Vitals and nursing note reviewed.  ?Constitutional:   ?   General: She is not in acute distress. ?HENT:  ?   Head: Normocephalic.  ?   Nose: Nose normal.  ?Eyes:  ?   Pupils: Pupils are equal, round, and reactive to light.  ?Cardiovascular:  ?   Rate and Rhythm: Normal rate.  ?Pulmonary:  ?   Effort: Pulmonary effort is normal.  ?Musculoskeletal:  ?   Right lower leg: No edema.  ?   Left lower leg: No edema.  ?Skin: ?   General: Skin is warm and dry.  ?   Findings: Rash present.  ? ?    ?   Comments: Macular erythema on upper and lower extremities and neck.  Right forearm is warm to palpation with more pronounced erythema.  ?Neurological:  ?   Mental Status: She is alert.  ? ? ? ?UC Treatments /  Results  ?Labs ?(all labs ordered are listed, but only abnormal results are displayed) ?Labs Reviewed - No data to display ? ?EKG ? ? ?Radiology ?No results found. ? ?Procedures ?Procedures (including critical care time) ? ?Medications Ordered in UC ?Medications  ?methylPREDNISolone sodium succinate (SOLU-MEDROL) 125 mg/2 mL injection 80 mg (80 mg Intramuscular Given 04/02/22 0927)  ? ? ?Initial Impression / Assessment and Plan / UC Course  ?I have reviewed the triage vital signs and the nursing notes. ? ?Pertinent labs & imaging results that were available during my care of the patient were reviewed by me and considered in my medical decision making (see chart for details). ? ?  ?Administered Solumedrol 80mg  IM; then begin prednisone burst/taper. ?Begin doxycycline for staph coverage. ?Followup with Family Doctor if not improved in about 5 days. ? ?Final Clinical Impressions(s) / UC Diagnoses  ? ?Final diagnoses:  ?Allergic contact dermatitis due to plants, except food  ?Cellulitis of right upper extremity  ? ? ? ?Discharge Instructions   ? ?  ?Begin prednisone Monday 04/03/22. ?May take Zyrtec for itching during the day. ?May continue to apply calamine lotion to weeping areas. ? ? ? ?ED Prescriptions   ? ? Medication Sig Dispense Auth. Provider  ? doxycycline (VIBRAMYCIN) 100 MG capsule Take one cap PO Q12hr with food. 14 capsule Tuesday, MD  ? predniSONE (DELTASONE) 20 MG tablet Take one tab by mouth twice daily for 4 days, then one daily for 3 days. Take with food. 11 tablet 04/05/22, MD  ? ?  ? ? ?  ?Lattie Haw, MD ?04/03/22 1423 ? ?

## 2022-04-02 NOTE — ED Triage Notes (Addendum)
Clearing land on Monday - exposed to poison oak  ?Calamine lotion mixed w/ peppermint oil ?Rash noted to BLE , RUE, neck, LUE ?Here w/ her wife  ?Started on a leftover medrol dose pak on wed - finished on saturday ?

## 2022-04-03 ENCOUNTER — Encounter: Payer: Self-pay | Admitting: Podiatry

## 2022-04-13 ENCOUNTER — Emergency Department (INDEPENDENT_AMBULATORY_CARE_PROVIDER_SITE_OTHER)
Admission: EM | Admit: 2022-04-13 | Discharge: 2022-04-13 | Disposition: A | Discharge status: Home / Self Care | Payer: BC Managed Care – PPO | Source: Home / Self Care

## 2022-04-13 DIAGNOSIS — L237 Allergic contact dermatitis due to plants, except food: Secondary | ICD-10-CM | POA: Diagnosis not present

## 2022-04-13 MED ORDER — METHYLPREDNISOLONE 4 MG PO TBPK: ORAL_TABLET | ORAL | 0 refills | Status: DC

## 2022-04-13 MED ORDER — DEXAMETHASONE SODIUM PHOSPHATE 10 MG/ML IJ SOLN
10.0000 mg | INTRAMUSCULAR | Status: AC
  Administered 2022-04-13: 10 mg via INTRAMUSCULAR

## 2022-04-13 NOTE — ED Triage Notes (Signed)
Pt presents to Urgent Care with c/o rash to arms and legs. States she came to [facility] a few weeks ago and was treated for poison oak. Reports overall improvement but has not resolved.

## 2022-04-13 NOTE — Discharge Instructions (Addendum)
Instructed patient to start Medrol Dosepak tomorrow morning Friday, 04/14/2022.  Advised patient to take medication as directed with food to completion.  Encouraged patient to increase daily water intake while taking this medication.  Advised patient to change bed linens for the next 2 to 3 days to avoid recontamination.  Advised patient if symptoms worsen and/or unresolved please follow-up with PCP or here for further evaluation.

## 2022-04-13 NOTE — ED Provider Notes (Signed)
[facility]    CSN: [id number] Arrival date & time: 04/13/22  1420      History   Chief Complaint Chief Complaint  Patient presents with   Rash    HPI Laura Bowers is a 41 y.o. female.   HPI 41 year old female presents with a rash of arms and legs.  Reports was evaluated here several weeks ago for poison ivy dermatitis reports overall improvement but some is still not resolved.  Past Medical History:  Diagnosis Date   Heel spur    left foot   Plantar fasciitis of left foot    left foot    Patient Active Problem List   Diagnosis Date Noted   Generalized anxiety disorder 12/22/2016   Routine health maintenance 08/19/2012   Strain of right trapezius muscle 08/19/2012    Past Surgical History:  Procedure Laterality Date   WRIST SURGERY     left   WRIST SURGERY Left     OB History   No obstetric history on file.      Home Medications    Prior to Admission medications   Medication Sig Start Date End Date Taking? Authorizing Provider  cetirizine (ZYRTEC) 10 MG tablet Take 10 mg by mouth daily.   Yes [provider]  methylPREDNISolone (MEDROL DOSEPAK) 4 MG TBPK tablet Take as directed 04/13/22  Yes Trevor Iha, FNP  PARoxetine (PAXIL) 10 MG tablet Take 10 mg by mouth daily.   Yes [provider]  omeprazole (PRILOSEC) 20 MG capsule Take 20 mg by mouth daily. 02/20/22   [provider]  WEGOVY 0.5 MG/0.5ML SOAJ Inject 0.5 mg into the skin once a week. 03/22/22   [provider]    Family History Family History  Problem Relation Age of Onset   Healthy Mother    Cirrhosis Father    Cancer Maternal Grandmother    Dementia Maternal Grandfather    Alcohol abuse Father     Social History Social History   Tobacco Use   Smoking status: Some Days    Types: Cigars   Smokeless tobacco: Never  Vaping Use   Vaping Use: Never used  Substance Use Topics   Alcohol use: Not Currently   Drug use: Never      Allergies   Patient has no known allergies.   Review of Systems Review of Systems  Skin:  Positive for rash.  All other systems reviewed and are negative.   Physical Exam Triage Vital Signs ED Triage Vitals  Enc Vitals Group     BP 04/13/22 1440 111/73     Pulse Rate 04/13/22 1440 91     Resp 04/13/22 1440 20     Temp 04/13/22 1440 98.7 F (37.1 C)     Temp Source 04/13/22 1440 Oral     SpO2 04/13/22 1440 98 %     Weight 04/13/22 1436 170 lb (77.1 kg)     Height 04/13/22 1436 5\' 4"  (1.626 m)     Head Circumference --      Peak Flow --      Pain Score 04/13/22 1436 0     Pain Loc --      Pain Edu? --      Excl. in GC? --    No data found.  Updated Vital Signs BP 111/73 (BP Location: Left Arm)   Pulse 91   Temp 98.7 F (37.1 C) (Oral)   Resp 20   Ht 5\' 4"  (1.626 m)   Wt  170 lb (77.1 kg)   LMP 03/26/2022 (Exact Date)   SpO2 98%   BMI 29.18 kg/m     Physical Exam Vitals and nursing note reviewed.  Constitutional:      General: She is not in acute distress.    Appearance: Normal appearance. She is obese. She is not ill-appearing.  HENT:     Head: Normocephalic and atraumatic.     Mouth/Throat:     Mouth: Mucous membranes are moist.     Pharynx: Oropharynx is clear.  Eyes:     Extraocular Movements: Extraocular movements intact.     Conjunctiva/sclera: Conjunctivae normal.     Pupils: Pupils are equal, round, and reactive to light.  Cardiovascular:     Rate and Rhythm: Normal rate and regular rhythm.     Pulses: Normal pulses.     Heart sounds: Normal heart sounds.  Pulmonary:     Effort: Pulmonary effort is normal.     Breath sounds: Normal breath sounds. No wheezing, rhonchi or rales.  Musculoskeletal:     Cervical back: Normal range of motion and neck supple.  Skin:    General: Skin is warm and dry.     Comments: Bilateral upper/lower arms, upper/lower legs: Pruritic diffuse erythematous maculopapular eruption with linear crusted lesions  noted  Neurological:     General: No focal deficit present.     Mental Status: She is alert and oriented to person, place, and time. Mental status is at baseline.     UC Treatments / Results  Labs (all labs ordered are listed, but only abnormal results are displayed) Labs Reviewed - No data to display  EKG   Radiology No results found.  Procedures Procedures (including critical care time)  Medications Ordered in UC Medications  dexamethasone (DECADRON) injection 10 mg (10 mg Intramuscular Given 04/13/22 1458)    Initial Impression / Assessment and Plan / UC Course  I have reviewed the triage vital signs and the nursing notes.  Pertinent labs & imaging results that were available during my care of the patient were reviewed by me and considered in my medical decision making (see chart for details).     MDM: 1.  Poison ivy dermatitis-IM Decadron 10 mg given once in clinic prior to discharge, Rx'd Medrol Dosepak. Instructed patient to start Medrol Dosepak tomorrow morning Friday, 04/14/2022.  Advised patient to take medication as directed with food to completion.  Encouraged patient to increase daily water intake while taking this medication.  Advised patient to change bed linens for the next 2 to 3 days to avoid recontamination.  Advised patient if symptoms worsen and/or unresolved please follow-up with PCP or here for further evaluation.  Final Clinical Impressions(s) / UC Diagnoses   Final diagnoses:  Poison ivy dermatitis     Discharge Instructions      Instructed patient to start Medrol Dosepak tomorrow morning Friday, 04/14/2022.  Advised patient to take medication as directed with food to completion.  Encouraged patient to increase daily water intake while taking this medication.  Advised patient to change bed linens for the next 2 to 3 days to avoid recontamination.  Advised patient if symptoms worsen and/or unresolved please follow-up with PCP or here for further  evaluation.     ED Prescriptions     Medication Sig Dispense Auth. Provider   methylPREDNISolone (MEDROL DOSEPAK) 4 MG TBPK tablet Take as directed 1 each Trevor Ihaagan, Kotaro Buer, FNP      PDMP not reviewed this encounter.   Abimelec Grochowski,  Casimiro Needle, FNP 04/13/22 1523

## 2022-05-02 ENCOUNTER — Emergency Department (INDEPENDENT_AMBULATORY_CARE_PROVIDER_SITE_OTHER): Payer: BC Managed Care – PPO

## 2022-05-02 ENCOUNTER — Emergency Department (INDEPENDENT_AMBULATORY_CARE_PROVIDER_SITE_OTHER)
Admission: EM | Admit: 2022-05-02 | Discharge: 2022-05-02 | Disposition: A | Discharge status: Home / Self Care | Payer: BC Managed Care – PPO | Source: Home / Self Care | Attending: Family Medicine | Admitting: Family Medicine

## 2022-05-02 ENCOUNTER — Encounter: Payer: Self-pay | Admitting: Emergency Medicine

## 2022-05-02 DIAGNOSIS — M4317 Spondylolisthesis, lumbosacral region: Secondary | ICD-10-CM | POA: Diagnosis not present

## 2022-05-02 DIAGNOSIS — M545 Low back pain, unspecified: Secondary | ICD-10-CM | POA: Diagnosis not present

## 2022-05-02 DIAGNOSIS — M549 Dorsalgia, unspecified: Secondary | ICD-10-CM

## 2022-05-02 LAB — POCT URINALYSIS DIP (MANUAL ENTRY)
Bilirubin, UA: NEGATIVE
Glucose, UA: NEGATIVE mg/dL
Ketones, POC UA: NEGATIVE mg/dL
Leukocytes, UA: NEGATIVE
Nitrite, UA: NEGATIVE
Spec Grav, UA: >=1.03 — AB (ref 1.010–1.025)
Urobilinogen, UA: 0.2 E.U./dL
pH, UA: 5.5 (ref 5.0–8.0)

## 2022-05-02 MED ORDER — CYCLOBENZAPRINE HCL 10 MG PO TABS: 10.0000 mg | ORAL_TABLET | Freq: Three times a day (TID) | ORAL | 1 refills | Status: DC

## 2022-05-02 NOTE — ED Triage Notes (Signed)
Right sided back pain x 1 week  Intermittent pain  Taking baclofen & flexeril for pain w/o relief  Woke up from pain last night at 0200 Naproxen at 1300 Denies fever or nausea

## 2022-05-02 NOTE — Discharge Instructions (Addendum)
Continue Flexeril 10mg  three times daily. Take Ibuprofen 200mg , 4 tabs every 8 hours with food.  Avoid heavy lifting, pushing, and pulling.

## 2022-05-02 NOTE — ED Provider Notes (Signed)
[facility]    CSN: [id number] Arrival date & time: 05/02/22  1340      History   Chief Complaint Chief Complaint  Patient presents with   Back Pain    right    HPI Laura Bowers is a 41 y.o. female.   About 1+ weeks ago patient suddenly developed right lower back pain.  She subsequently developed left lower back pain that is now predominant and worse.  The pain is sharp, intermittent and non-radiating.  She was unable to sleep last night because of the pain.  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  She has not had much improvement with baclofen or Flexeril.  The history is provided by the patient.  Back Pain Location:  Lumbar spine Quality: sharp. Radiates to:  Does not radiate Pain severity:  Moderate Pain is:  Same all the time Onset quality:  Sudden Duration:  1 week Timing:  Intermittent Progression:  Worsening Chronicity:  New Context: lifting heavy objects and physical stress   Context: not falling, not MCA, not occupational injury and not recent injury   Relieved by:  Nothing Worsened by:  Bending, deep breathing, movement and twisting Ineffective treatments:  NSAIDs and muscle relaxants Associated symptoms: no abdominal pain, no bladder incontinence, no bowel incontinence, no chest pain, no dysuria, no leg pain, no numbness, no paresthesias, no pelvic pain, no perianal numbness, no tingling and no weakness     Past Medical History:  Diagnosis Date   Heel spur    left foot   Plantar fasciitis of left foot    left foot    Patient Active Problem List   Diagnosis Date Noted   Generalized anxiety disorder 12/22/2016   Routine health maintenance 08/19/2012   Strain of right trapezius muscle 08/19/2012    Past Surgical History:  Procedure Laterality Date   WRIST SURGERY     left   WRIST SURGERY Left     OB History   No obstetric history on file.       Home Medications    Prior to Admission medications   Medication Sig Start Date End Date Taking? Authorizing Provider  cyclobenzaprine (FLEXERIL) 10 MG tablet Take 1 tablet (10 mg total) by mouth 3 (three) times daily. 05/02/22  Yes Lattie Haw, MD  cetirizine (ZYRTEC) 10 MG tablet Take 10 mg by mouth daily.    [provider]  methylPREDNISolone (MEDROL DOSEPAK) 4 MG TBPK tablet Take as directed Patient not taking: Reported on 05/02/2022 04/13/22   Trevor Iha, FNP  omeprazole (PRILOSEC) 20 MG capsule Take 20 mg by mouth daily. 02/20/22   [provider]  PARoxetine (PAXIL) 10 MG tablet Take 10 mg by mouth daily.    [provider]  WEGOVY 0.5 MG/0.5ML SOAJ Inject 0.5 mg into the skin once a week. 03/22/22   [provider]    Family History Family History  Problem Relation Age of Onset   Healthy Mother    Cirrhosis Father    Cancer Maternal Grandmother    Dementia Maternal Grandfather    Alcohol abuse Father     Social History Social History   Tobacco Use   Smoking status: Some Days    Types: Cigars   Smokeless tobacco: Never  Vaping Use   Vaping Use: Never used  Substance Use Topics   Alcohol use: Not Currently   Drug use: Never  Allergies   Patient has no known allergies.   Review of Systems Review of Systems  Cardiovascular:  Negative for chest pain.  Gastrointestinal:  Negative for abdominal pain and bowel incontinence.  Genitourinary:  Negative for bladder incontinence, dysuria, flank pain, frequency, hematuria, pelvic pain and urgency.  Musculoskeletal:  Positive for back pain.  Skin:  Negative for rash.  Neurological:  Negative for tingling, weakness, numbness and paresthesias.  All other systems reviewed and are negative.    Physical Exam Triage Vital Signs ED Triage Vitals  Enc Vitals Group     BP 05/02/22 1453 114/81     Pulse Rate 05/02/22 1453 70     Resp 05/02/22 1453 16     Temp 05/02/22 1453  99 F (37.2 C)     Temp Source 05/02/22 1453 Oral     SpO2 05/02/22 1453 100 %     Weight 05/02/22 1457 170 lb (77.1 kg)     Height 05/02/22 1457 5\' 4"  (1.626 m)     Head Circumference --      Peak Flow --      Pain Score 05/02/22 1456 0     Pain Loc --      Pain Edu? --      Excl. in GC? --    No data found.  Updated Vital Signs BP 114/81 (BP Location: Left Arm)   Pulse 70   Temp 99 F (37.2 C) (Oral)   Resp 16   Ht 5\' 4"  (1.626 m)   Wt 77.1 kg   LMP 04/24/2022 (Exact Date)   SpO2 100%   BMI 29.18 kg/m   Visual Acuity Right Eye Distance:   Left Eye Distance:   Bilateral Distance:    Right Eye Near:   Left Eye Near:    Bilateral Near:     Physical Exam Vitals and nursing note reviewed.  Constitutional:      General: She is not in acute distress. HENT:     Head: Normocephalic.     Mouth/Throat:     Pharynx: Oropharynx is clear.  Eyes:     Conjunctiva/sclera: Conjunctivae normal.     Pupils: Pupils are equal, round, and reactive to light.  Cardiovascular:     Rate and Rhythm: Normal rate and regular rhythm.     Heart sounds: Normal heart sounds.  Pulmonary:     Breath sounds: Normal breath sounds.  Abdominal:     Palpations: Abdomen is soft.     Tenderness: There is no abdominal tenderness.  Musculoskeletal:     Cervical back: Normal range of motion.     Lumbar back: Tenderness present. No swelling or bony tenderness. Normal range of motion. Negative right straight leg raise test and negative left straight leg raise test.       Back:     Right lower leg: No edema.     Left lower leg: No edema.     Comments: Back:  Range of motion relatively well preserved.  Can heel/toe walk and squat without difficulty. Tenderness in the left paraspinous muscles from approximately L4 to Sacral area.  Straight leg raising test is negative.  Sitting knee extension test is negative.  Strength and sensation in the lower extremities is normal.  Patellar and achilles reflexes  are normal   Skin:    General: Skin is warm and dry.     Findings: No rash.  Neurological:     General: No focal deficit present.     Mental  Status: She is alert.     UC Treatments / Results  Labs (all labs ordered are listed, but only abnormal results are displayed) Labs Reviewed  POCT URINALYSIS DIP (MANUAL ENTRY) - Abnormal; Notable for the following components:      Result Value   Clarity, UA cloudy (*)    Spec Grav, UA >=1.030 (*)    Blood, UA small (*)    Protein Ur, POC trace (*)    All other components within normal limits    EKG   Radiology DG Lumbar Spine Complete  Result Date: 05/02/2022 CLINICAL DATA:  Back pain. EXAM: LUMBAR SPINE - COMPLETE 4+ VIEW COMPARISON:  None Available. FINDINGS: Bilateral pars defects at L5 with a grade 1 spondylolisthesis and associated degenerative disc disease at L5-S1. The other lumbar vertebral bodies are normally aligned and the other disc spaces are maintained. No acute bony findings. The visualized bony pelvis is intact. The SI joints appear normal. IMPRESSION: Bilateral pars defects at L5 with a grade 1 spondylolisthesis and associated degenerative disc disease at L5-S1. Electronically Signed   By: Rudie MeyerP.  Gallerani M.D.   On: 05/02/2022 16:48    Procedures Procedures (including critical care time)  Medications Ordered in UC Medications - No data to display  Initial Impression / Assessment and Plan / UC Course  I have reviewed the triage vital signs and the nursing notes.  Pertinent labs & imaging results that were available during my care of the patient were reviewed by me and considered in my medical decision making (see chart for details).    ?spondylolisthesis etiology of back pain. Rx for Flexeril. Recommend follow-up with sports medicine for further evaluation and treatment.  Final Clinical Impressions(s) / UC Diagnoses   Final diagnoses:  Acute left-sided low back pain without sciatica  Spondylolisthesis at L5-S1  level     Discharge Instructions      Continue Flexeril 10mg  three times daily. Take Ibuprofen 200mg , 4 tabs every 8 hours with food.  Avoid heavy lifting, pushing, and pulling.     ED Prescriptions     Medication Sig Dispense Auth. Provider   cyclobenzaprine (FLEXERIL) 10 MG tablet Take 1 tablet (10 mg total) by mouth 3 (three) times daily. 20 tablet Lattie HawBeese, Terran Hollenkamp A, MD         Lattie HawBeese, Gloristine Turrubiates A, MD 05/03/22 1039

## 2022-07-12 ENCOUNTER — Ambulatory Visit
Admission: EM | Admit: 2022-07-12 | Discharge: 2022-07-12 | Disposition: A | Discharge status: Home / Self Care | Payer: BC Managed Care – PPO | Attending: Family Medicine | Admitting: Family Medicine

## 2022-07-12 ENCOUNTER — Ambulatory Visit (INDEPENDENT_AMBULATORY_CARE_PROVIDER_SITE_OTHER): Payer: BC Managed Care – PPO

## 2022-07-12 ENCOUNTER — Encounter: Payer: Self-pay | Admitting: Emergency Medicine

## 2022-07-12 DIAGNOSIS — M25421 Effusion, right elbow: Secondary | ICD-10-CM

## 2022-07-12 DIAGNOSIS — S5001XA Contusion of right elbow, initial encounter: Secondary | ICD-10-CM | POA: Diagnosis not present

## 2022-07-12 DIAGNOSIS — M25521 Pain in right elbow: Secondary | ICD-10-CM

## 2022-07-12 NOTE — Discharge Instructions (Signed)
Continue with ice to the area as much as you can tolerate Limit use of arm while it is painful May take over-the-counter medicines as needed for pain If you fail to improve as expected, consult sports medicine

## 2022-07-12 NOTE — ED Provider Notes (Signed)
[facility]    CSN: [id number] Arrival date & time: 07/12/22  1806      History   Chief Complaint Chief Complaint  Patient presents with   Joint Swelling    Right     HPI Laura Bowers is a 41 y.o. female.   HPI  Patient accidentally wrapped her elbow forcefully on a trash can container in the ladies restroom with the lid open.  She had a sharp metal edge.  It was immediately painful.  She was able to make it through her shift, but then after she went home the elbow area doubled in size, became discolored, and became much more painful.  She is here concerned for fracture  Past Medical History:  Diagnosis Date   Heel spur    left foot   Plantar fasciitis of left foot    left foot    Patient Active Problem List   Diagnosis Date Noted   Generalized anxiety disorder 12/22/2016   Routine health maintenance 08/19/2012   Strain of right trapezius muscle 08/19/2012    Past Surgical History:  Procedure Laterality Date   WRIST SURGERY     left   WRIST SURGERY Left     OB History   No obstetric history on file.      Home Medications    Prior to Admission medications   Medication Sig Start Date End Date Taking? Authorizing Provider  cetirizine (ZYRTEC) 10 MG tablet Take 10 mg by mouth daily.    [provider]  omeprazole (PRILOSEC) 20 MG capsule Take 20 mg by mouth daily. 02/20/22   [provider]  PARoxetine (PAXIL) 10 MG tablet Take 10 mg by mouth daily.    [provider]  WEGOVY 1.7 MG/0.75ML SOAJ SMARTSIG:1.7 Milligram(s) SUB-Q Once a Week 07/06/22   [provider]    Family History Family History  Problem Relation Age of Onset   Healthy Mother    Cirrhosis Father    Cancer Maternal Grandmother    Dementia Maternal Grandfather    Alcohol abuse Father     Social History Social History   Tobacco Use   Smoking status: Some Days    Types: Cigars   Smokeless tobacco: Never  Vaping Use   Vaping Use:  Never used  Substance Use Topics   Alcohol use: Not Currently   Drug use: Never     Allergies   Patient has no known allergies.   Review of Systems Review of Systems  See HPI Physical Exam Triage Vital Signs ED Triage Vitals  Enc Vitals Group     BP 07/12/22 1823 113/77     Pulse Rate 07/12/22 1823 85     Resp 07/12/22 1823 14     Temp 07/12/22 1823 99.5 F (37.5 C)     Temp Source 07/12/22 1823 Oral     SpO2 07/12/22 1823 99 %     Weight 07/12/22 1827 160 lb (72.6 kg)     Height 07/12/22 1827 5\' 4"  (1.626 m)     Head Circumference --      Peak Flow --      Pain Score 07/12/22 1826 3     Pain Loc --      Pain Edu? --      Excl. in GC? --    No data found.  Updated Vital Signs BP 113/77 (BP Location: Left Arm)   Pulse 85   Temp 99.5 F (37.5 C) (Oral)   Resp 14  Ht 5\' 4"  (1.626 m)   Wt 72.6 kg   LMP 07/05/2022 (Exact Date)   SpO2 99%   BMI 27.46 kg/m        Physical Exam Constitutional:      General: She is not in acute distress.    Appearance: She is well-developed. She is not ill-appearing.  HENT:     Head: Normocephalic and atraumatic.  Eyes:     Conjunctiva/sclera: Conjunctivae normal.     Pupils: Pupils are equal, round, and reactive to light.  Cardiovascular:     Rate and Rhythm: Normal rate.  Pulmonary:     Effort: Pulmonary effort is normal. No respiratory distress.  Abdominal:     General: There is no distension.     Palpations: Abdomen is soft.  Musculoskeletal:        General: Swelling, tenderness and deformity present. Normal range of motion.     Cervical back: Normal range of motion.     Comments: The right elbow has a very tender and tense olecranon bursa with a bluish hue indicating hematoma.  X-rays negative patient can flex to 90 degrees, slowly, and extend fully  Skin:    General: Skin is warm and dry.  Neurological:     General: No focal deficit present.     Mental Status: She is alert.  Psychiatric:        Mood and  Affect: Mood normal.        Behavior: Behavior normal.      UC Treatments / Results  Labs (all labs ordered are listed, but only abnormal results are displayed) Labs Reviewed - No data to display  EKG   Radiology DG ELBOW COMPLETE RIGHT (3+VIEW)  Result Date: 07/12/2022 CLINICAL DATA:  Hit right elbow on metal container today. Right elbow pain and swelling. EXAM: RIGHT ELBOW - COMPLETE 3+ VIEW COMPARISON:  None Available. FINDINGS: There is no evidence of fracture, dislocation, or joint effusion. There is no evidence of arthropathy or other focal bone abnormality. Soft tissue swelling is seen overlying the olecranon process. IMPRESSION: Soft tissue swelling overlying the olecranon process. No evidence of fracture or dislocation. Electronically Signed   By: 07/14/2022 M.D.   On: 07/12/2022 18:54    Procedures Procedures (including critical care time)  Medications Ordered in UC Medications - No data to display  Initial Impression / Assessment and Plan / UC Course  I have reviewed the triage vital signs and the nursing notes.  Pertinent labs & imaging results that were available during my care of the patient were reviewed by me and considered in my medical decision making (see chart for details).     Discussed drainage of the olecranon bursa, but concern exists for introducing infection.  Conservative management is preferred.  Patient agrees. Final Clinical Impressions(s) / UC Diagnoses   Final diagnoses:  Effusion of right olecranon bursa  Traumatic hematoma of right elbow, initial encounter     Discharge Instructions      Continue with ice to the area as much as you can tolerate Limit use of arm while it is painful May take over-the-counter medicines as needed for pain If you fail to improve as expected, consult sports medicine   ED Prescriptions   None    PDMP not reviewed this encounter.   07/14/2022, MD 07/12/22 [phone]

## 2022-07-12 NOTE — ED Triage Notes (Signed)
Hit her right elbow on the feminine trash can  in the bathroom today at 1200 Immediate pain & swelling per pt  Swelling has doubled since then  Pain with movement  to Right elbow

## 2022-07-13 ENCOUNTER — Telehealth: Payer: Self-pay | Admitting: Emergency Medicine

## 2022-07-13 NOTE — Telephone Encounter (Signed)
Call to [facility] to see how she was doing today. Swelling has gone down to R elbow. Continuing to ice it. Pain has resolved per Marcelle Smiling. Pt thanked RN for follow up call & care yesterday.

## 2023-03-29 ENCOUNTER — Ambulatory Visit
Admission: EM | Admit: 2023-03-29 | Discharge: 2023-03-29 | Disposition: A | Discharge status: Home / Self Care | Payer: BC Managed Care – PPO | Attending: Family Medicine | Admitting: Family Medicine

## 2023-03-29 DIAGNOSIS — R059 Cough, unspecified: Secondary | ICD-10-CM | POA: Diagnosis not present

## 2023-03-29 DIAGNOSIS — J40 Bronchitis, not specified as acute or chronic: Secondary | ICD-10-CM

## 2023-03-29 MED ORDER — DOXYCYCLINE HYCLATE 100 MG PO CAPS: 100.0000 mg | ORAL_CAPSULE | Freq: Two times a day (BID) | ORAL | 0 refills | Status: AC

## 2023-03-29 MED ORDER — METHYLPREDNISOLONE ACETATE 80 MG/ML IJ SUSP
80.0000 mg | Freq: Once | INTRAMUSCULAR | Status: AC
  Administered 2023-03-29: 80 mg via INTRAMUSCULAR

## 2023-03-29 MED ORDER — PREDNISONE 10 MG (21) PO TBPK: ORAL_TABLET | Freq: Every day | ORAL | 0 refills | Status: DC

## 2023-03-29 NOTE — Discharge Instructions (Addendum)
Instructed patient to take medication as directed with food to completion.  Advised patient to take Prednisone with first dose of Doxycycline for the next 10 days.  Encouraged increase daily water intake to 64 ounces per day while taking these medications.  Advised if symptoms worsen and/or unresolved please follow-up with PCP or here for further evaluation.  Advised patient may establish primary care at [facility] primary care at [facility] [address] [place], [place] [address] [phone]

## 2023-03-29 NOTE — ED Triage Notes (Addendum)
Pt c/o non productive cough and chills x 4 days. States some wheezing. Taking motrin. Will need note for work.

## 2023-03-29 NOTE — ED Provider Notes (Addendum)
[facility]    CSN: [id number] Arrival date & time: 03/29/23  0849      History   Chief Complaint Chief Complaint  Patient presents with   Cough   Chills    HPI Laura Bowers is a 42 y.o. female.   HPI 42 year old female presents with productive cough and chills for 4 days reports some wheezing currently taking Motrin.  PMH significant for GAD.  Patient request work note today.  Past Medical History:  Diagnosis Date   Heel spur    left foot   Plantar fasciitis of left foot    left foot    Patient Active Problem List   Diagnosis Date Noted   Generalized anxiety disorder 12/22/2016   Routine health maintenance 08/19/2012   Strain of right trapezius muscle 08/19/2012    Past Surgical History:  Procedure Laterality Date   WRIST SURGERY     left   WRIST SURGERY Left     OB History   No obstetric history on file.      Home Medications    Prior to Admission medications   Medication Sig Start Date End Date Taking? Authorizing Provider  doxycycline (VIBRAMYCIN) 100 MG capsule Take 1 capsule (100 mg total) by mouth 2 (two) times daily for 10 days. 03/29/23 04/08/23 Yes Trevor Iha, FNP  predniSONE (STERAPRED UNI-PAK 21 TAB) 10 MG (21) TBPK tablet Take by mouth daily. Take 6 tabs by mouth daily  for 2 days, then 5 tabs for 2 days, then 4 tabs for 2 days, then 3 tabs for 2 days, 2 tabs for 2 days, then 1 tab by mouth daily for 2 days 03/29/23  Yes Trevor Iha, FNP  cetirizine (ZYRTEC) 10 MG tablet Take 10 mg by mouth daily.    [provider]  omeprazole (PRILOSEC) 20 MG capsule Take 20 mg by mouth daily. 02/20/22   [provider]  PARoxetine (PAXIL) 10 MG tablet Take 10 mg by mouth daily.    [provider]  WEGOVY 1.7 MG/0.75ML SOAJ SMARTSIG:1.7 Milligram(s) SUB-Q Once a Week 07/06/22   [provider]    Family History Family History  Problem Relation Age of Onset   Healthy Mother    Cirrhosis Father    Cancer  Maternal Grandmother    Dementia Maternal Grandfather    Alcohol abuse Father     Social History Social History   Tobacco Use   Smoking status: Some Days    Types: Cigars   Smokeless tobacco: Never  Vaping Use   Vaping Use: Never used  Substance Use Topics   Alcohol use: Not Currently   Drug use: Never     Allergies   Patient has no known allergies.   Review of Systems Review of Systems  Constitutional:  Positive for chills.  Respiratory:  Positive for cough.   All other systems reviewed and are negative.    Physical Exam Triage Vital Signs ED Triage Vitals [03/29/23 0856]  Enc Vitals Group     BP 117/78     Pulse Rate 76     Resp 17     Temp 97.9 F (36.6 C)     Temp Source Oral     SpO2 99 %     Weight      Height      Head Circumference      Peak Flow      Pain Score 0     Pain Loc      Pain  Edu?      Excl. in GC?    No data found.  Updated Vital Signs BP 117/78 (BP Location: Left Arm)   Pulse 76   Temp 97.9 F (36.6 C) (Oral)   Resp 17   SpO2 99%      Physical Exam Vitals and nursing note reviewed.  Constitutional:      General: She is not in acute distress.    Appearance: Normal appearance. She is normal weight. She is ill-appearing.  HENT:     Head: Normocephalic and atraumatic.     Right Ear: Tympanic membrane, ear canal and external ear normal.     Left Ear: Tympanic membrane, ear canal and external ear normal.     Mouth/Throat:     Mouth: Mucous membranes are moist.     Pharynx: Oropharynx is clear.  Eyes:     Extraocular Movements: Extraocular movements intact.     Conjunctiva/sclera: Conjunctivae normal.     Pupils: Pupils are equal, round, and reactive to light.  Cardiovascular:     Rate and Rhythm: Normal rate and regular rhythm.     Pulses: Normal pulses.     Heart sounds: Normal heart sounds.  Pulmonary:     Effort: Pulmonary effort is normal.     Breath sounds: Rhonchi present. No wheezing or rales.     Comments:  Diffuse scattered rhonchi noted throughout, infrequent nonproductive cough on exam Musculoskeletal:        General: Normal range of motion.     Cervical back: Normal range of motion and neck supple. No tenderness.  Lymphadenopathy:     Cervical: No cervical adenopathy.  Skin:    General: Skin is warm and dry.  Neurological:     General: No focal deficit present.     Mental Status: She is alert and oriented to person, place, and time. Mental status is at baseline.      UC Treatments / Results  Labs (all labs ordered are listed, but only abnormal results are displayed) Labs Reviewed - No data to display  EKG   Radiology No results found.  Procedures Procedures (including critical care time)  Medications Ordered in UC Medications  methylPREDNISolone acetate (DEPO-MEDROL) injection 80 mg (80 mg Intramuscular Given 03/29/23 0951)    Initial Impression / Assessment and Plan / UC Course  I have reviewed the triage vital signs and the nursing notes.  Pertinent labs & imaging results that were available during my care of the patient were reviewed by me and considered in my medical decision making (see chart for details).     MDM: 1.  Cough-Rx'd Doxycycline 100 mg twice daily x 10 days; 2.  Bronchitis-IM Depo-Medrol 80 mg given once in clinic, Rx'd Sterapred Unipak (tapering from 60 mg to 10 mg over 10 days) work note provided for patient per request today. Instructed patient to take medication as directed with food to completion.  Advised patient to take Prednisone with first dose of Doxycycline for the next 10 days.  Encouraged increase daily water intake to 64 ounces per day while taking these medications.  Advised if symptoms worsen and/or unresolved please follow-up with PCP or here for further evaluation.  Advised patient may establish primary care at [facility] primary care at [facility] [address], [place], [place] [address] [phone].  Patient discharged home,  hemodynamically stable. Final Clinical Impressions(s) / UC Diagnoses   Final diagnoses:  Bronchitis  Cough, unspecified type     Discharge Instructions  Instructed patient to take medication as directed with food to completion.  Advised patient to take Prednisone with first dose of Doxycycline for the next 10 days.  Encouraged increase daily water intake to 64 ounces per day while taking these medications.  Advised if symptoms worsen and/or unresolved please follow-up with PCP or here for further evaluation.  Advised patient may establish primary care at [facility] primary care at [facility] [address], [place], [place] [address] [phone]     ED Prescriptions     Medication Sig Dispense Auth. Provider   doxycycline (VIBRAMYCIN) 100 MG capsule Take 1 capsule (100 mg total) by mouth 2 (two) times daily for 10 days. 20 capsule Trevor Iha, FNP   predniSONE (STERAPRED UNI-PAK 21 TAB) 10 MG (21) TBPK tablet Take by mouth daily. Take 6 tabs by mouth daily  for 2 days, then 5 tabs for 2 days, then 4 tabs for 2 days, then 3 tabs for 2 days, 2 tabs for 2 days, then 1 tab by mouth daily for 2 days 42 tablet Trevor Iha, FNP      PDMP not reviewed this encounter.   Trevor Iha, FNP 03/29/23 1017    Trevor Iha, FNP 03/29/23 1031

## 2023-08-14 IMAGING — DX DG LUMBAR SPINE COMPLETE 4+V
5 series · 5 of 5 positions shown · non-contrast
Comparison: None Available.

CLINICAL DATA: Back pain.

EXAM:
LUMBAR SPINE - COMPLETE 4+ VIEW

[l-spine ap]
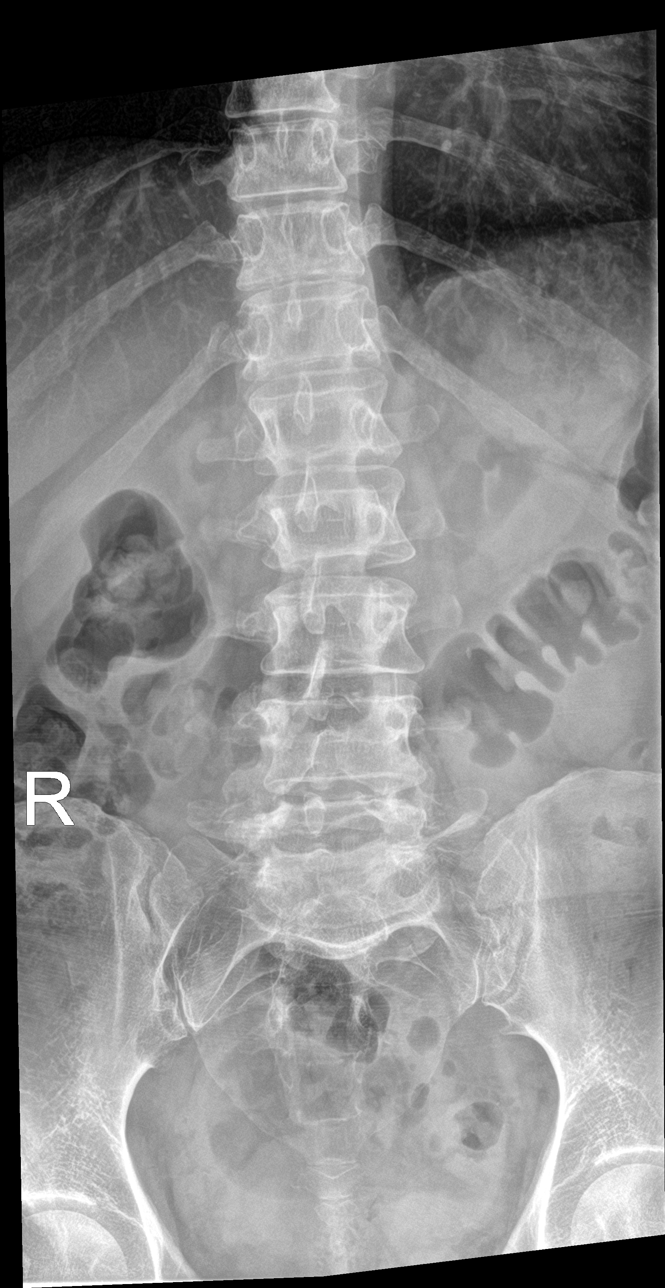

[l-spine obl (1 of 2)]
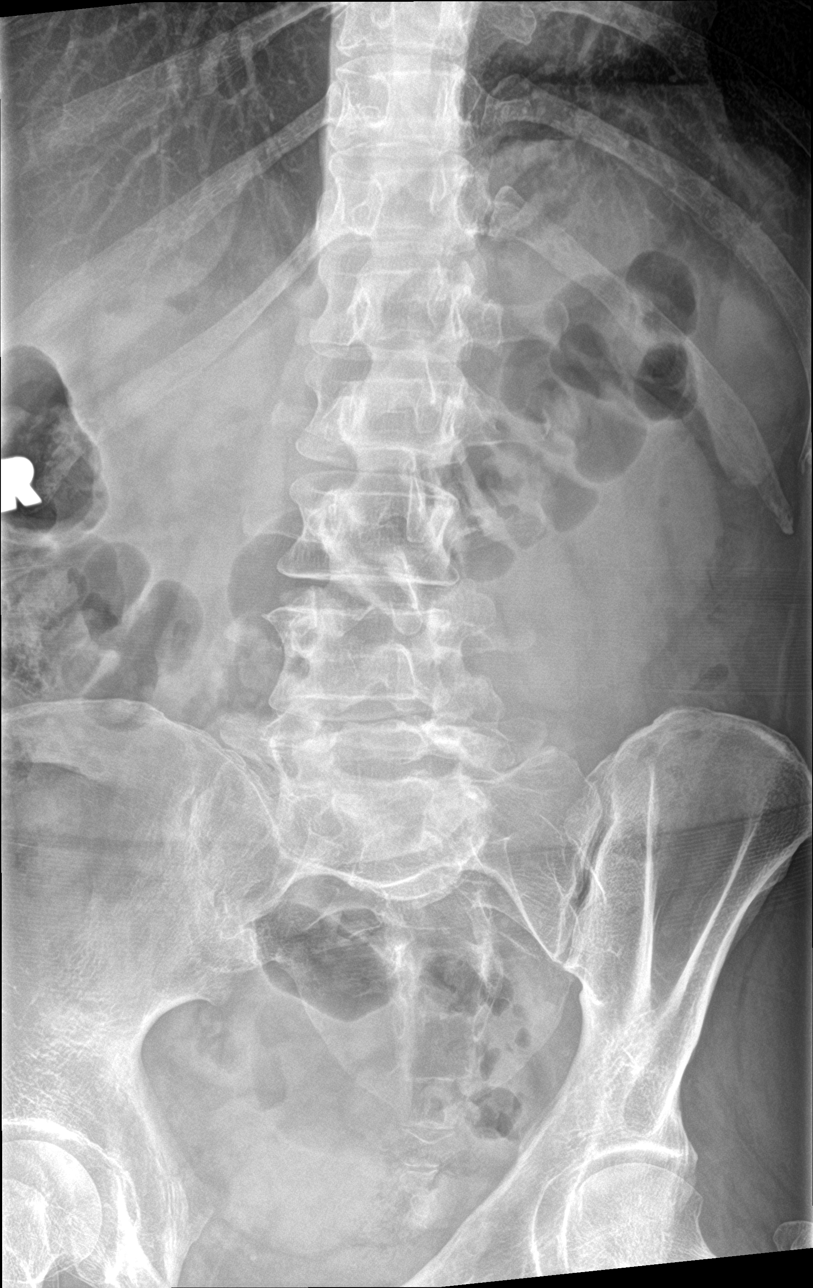

[l-spine obl (2 of 2)]
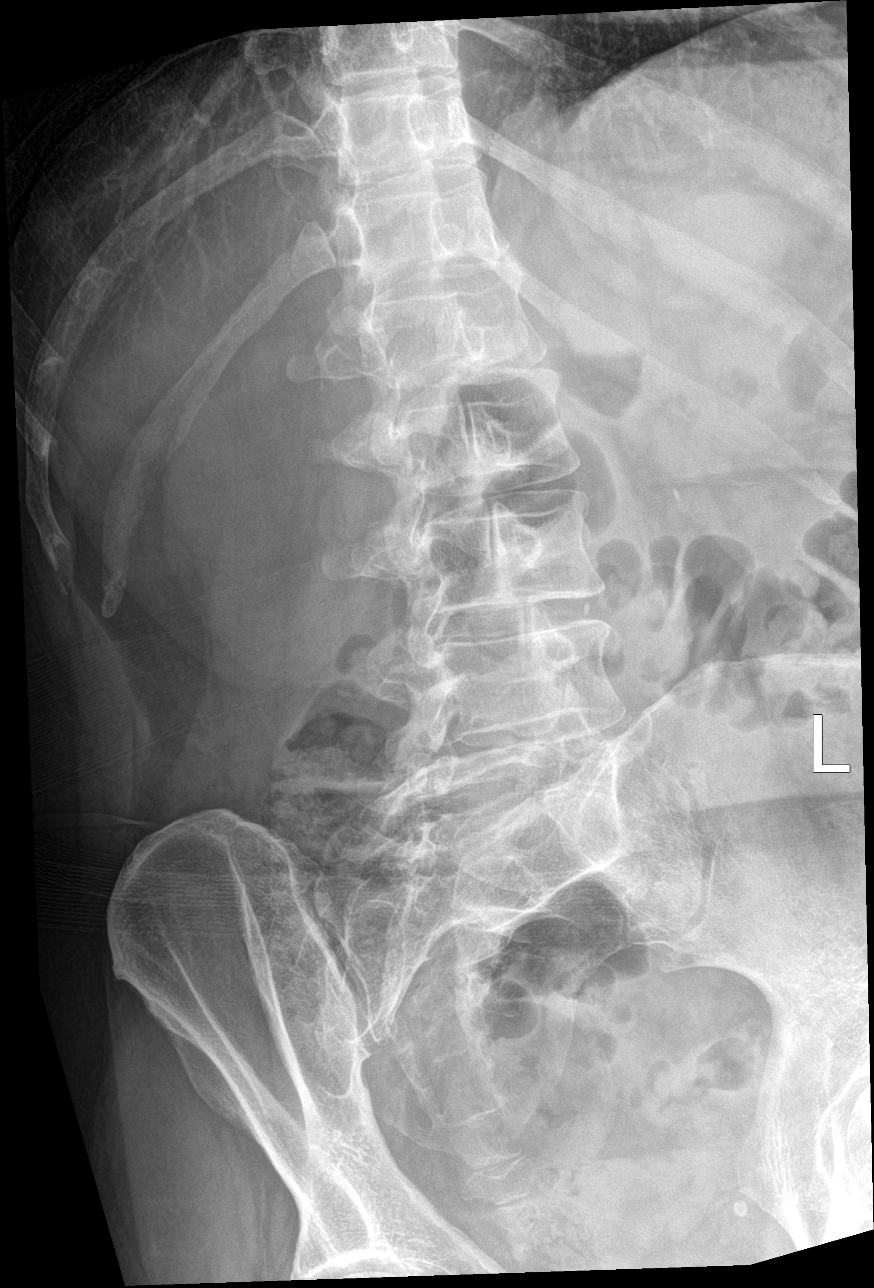

[l-spine lat]
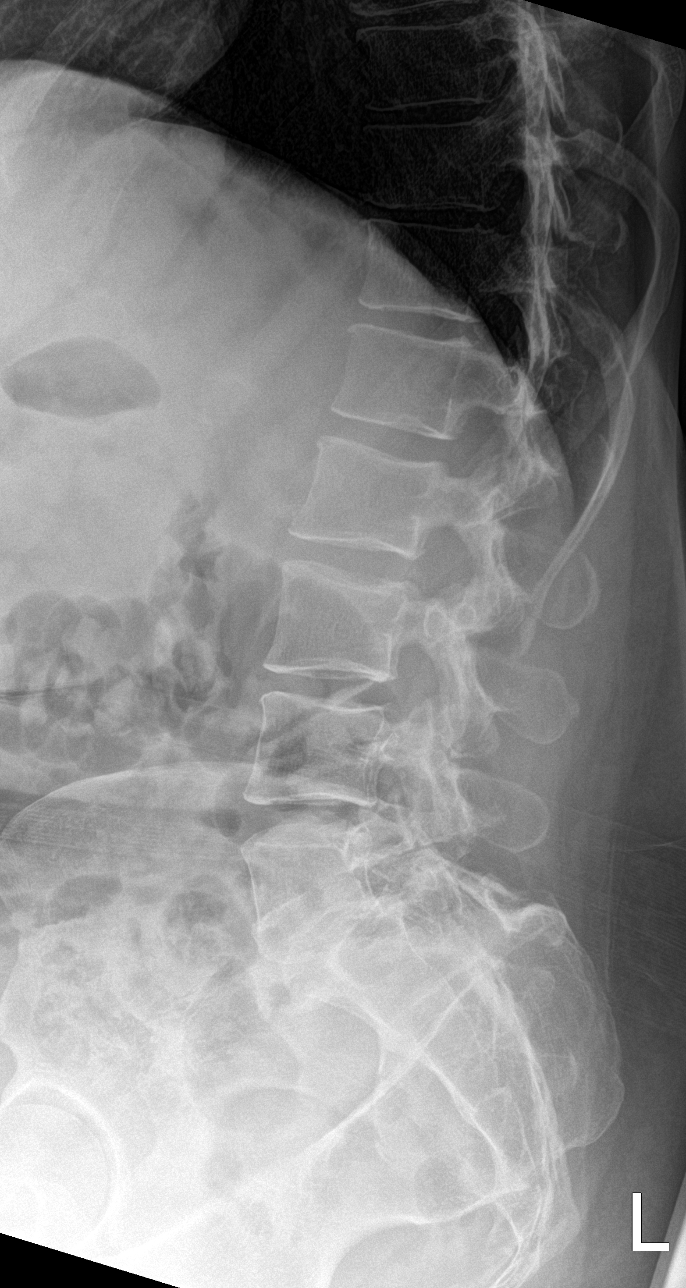

[l-spine spot]
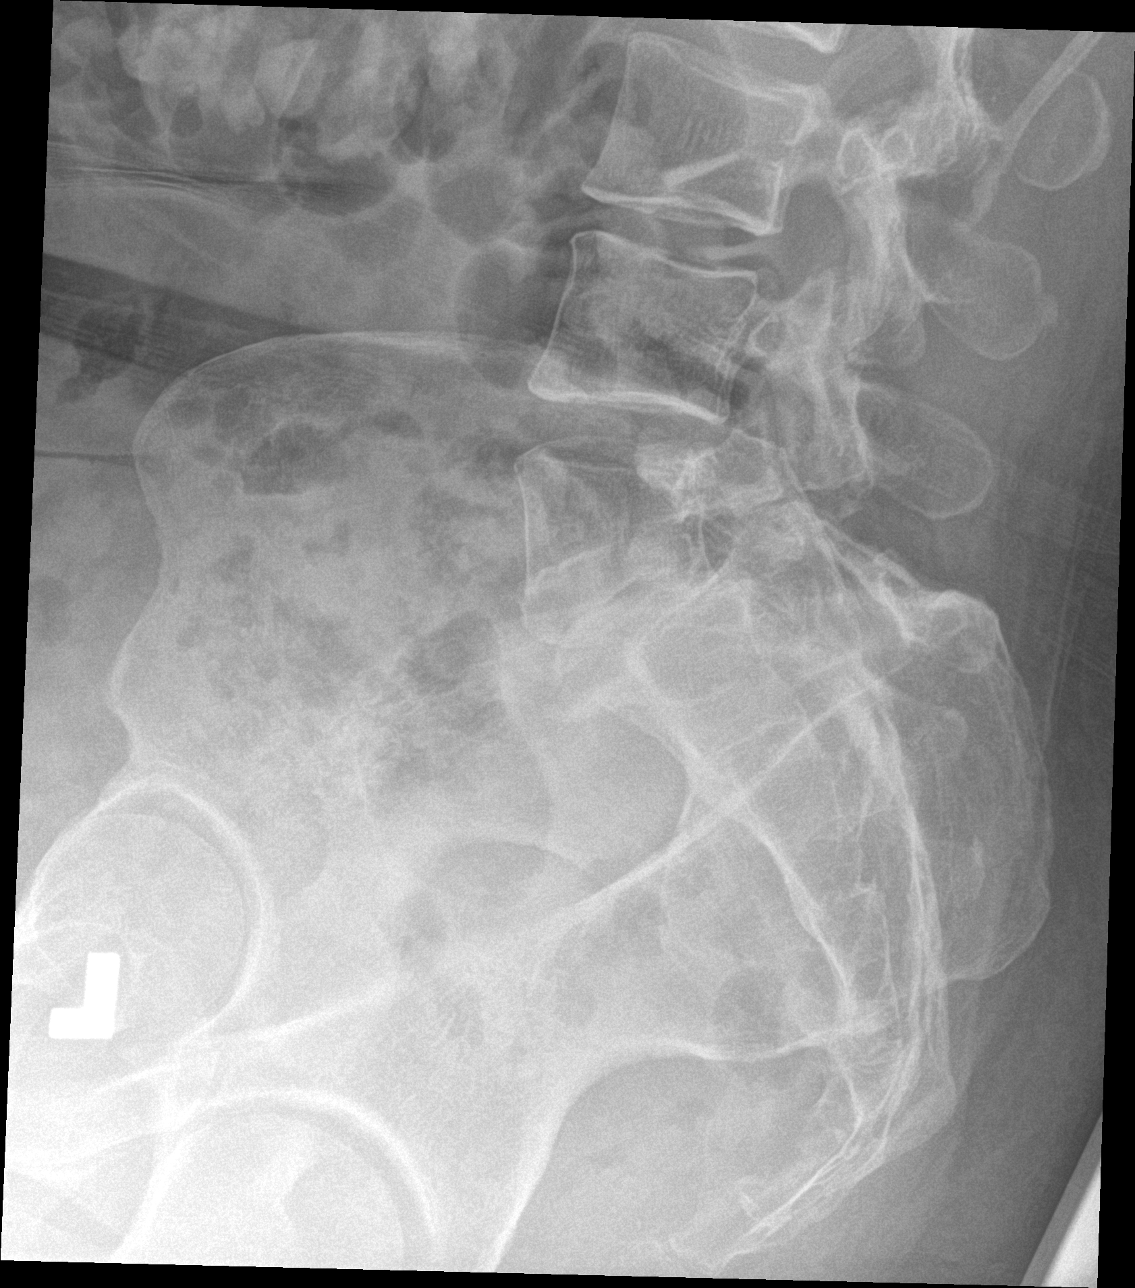

[5 of 5 positions shown; findings below may reference images not displayed]

FINDINGS: Bilateral pars defects at L5 with a grade 1 spondylolisthesis and
associated degenerative disc disease at L5-S1. The other lumbar
vertebral bodies are normally aligned and the other disc spaces are
maintained. No acute bony findings. The visualized bony pelvis is
intact. The SI joints appear normal.
IMPRESSION: Bilateral pars defects at L5 with a grade 1 spondylolisthesis and
associated degenerative disc disease at L5-S1.

## 2024-06-22 ENCOUNTER — Ambulatory Visit
Admission: EM | Admit: 2024-06-22 | Discharge: 2024-06-22 | Disposition: A | Discharge status: Home / Self Care | Attending: Family Medicine | Admitting: Family Medicine

## 2024-06-22 ENCOUNTER — Telehealth: Payer: Self-pay

## 2024-06-22 ENCOUNTER — Other Ambulatory Visit: Payer: Self-pay

## 2024-06-22 DIAGNOSIS — L235 Allergic contact dermatitis due to other chemical products: Secondary | ICD-10-CM | POA: Diagnosis not present

## 2024-06-22 MED ORDER — METHYLPREDNISOLONE ACETATE 80 MG/ML IJ SUSP
80.0000 mg | Freq: Once | INTRAMUSCULAR | Status: AC
  Administered 2024-06-22: 80 mg via INTRAMUSCULAR

## 2024-06-22 NOTE — Discharge Instructions (Signed)
 Antihistamines will help with the itching You need to double the usual dose for it to help 2 zyrtec/claritin/allegra in the daytime Benadryl at night Try to stay cool

## 2024-06-22 NOTE — ED Triage Notes (Addendum)
 Slept on bed that had laundered linens and is allergic to the detergent. Has blistered rash and itching to head, neck, arms. No difficulty breathing or abnormal throat sensations. This started Friday evening. Has extreme itching. Has used benadryl cream, benadryl po.

## 2024-06-22 NOTE — ED Provider Notes (Signed)
 [facility]    CSN: [id number] Arrival date & time: 06/22/24  0801      History   Chief Complaint Chief Complaint  Patient presents with   Allergic Reaction    HPI Laura Bowers is a 43 y.o. female.   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  She slept at a friend's house last night and woke up with a terribly itchy blistering rash on her face neck and arms.  This is similar to previous rashes.  It is a contact dermatitis.    Past Medical History:  Diagnosis Date   Heel spur    left foot   Plantar fasciitis of left foot    left foot    Patient Active Problem List   Diagnosis Date Noted   Generalized anxiety disorder 12/22/2016   Routine health maintenance 08/19/2012   Strain of right trapezius muscle 08/19/2012    Past Surgical History:  Procedure Laterality Date   WRIST SURGERY     left   WRIST SURGERY Left     OB History   No obstetric history on file.      Home Medications    Prior to Admission medications   Medication Sig Start Date End Date Taking? Authorizing Provider  venlafaxine (EFFEXOR) 37.5 MG tablet Take 37.5 mg by mouth 2 (two) times daily.   Yes [provider]  omeprazole (PRILOSEC) 20 MG capsule Take 20 mg by mouth daily. 02/20/22   [provider]    Family History Family History  Problem Relation Age of Onset   Healthy Mother    Cirrhosis Father    Cancer Maternal Grandmother    Dementia Maternal Grandfather    Alcohol abuse Father     Social History Social History   Tobacco Use   Smoking status: Some Days    Types: Cigars   Smokeless tobacco: Never  Vaping Use   Vaping status: Never Used  Substance Use Topics   Alcohol use: Not Currently   Drug use: Never     Allergies   Other   Review of Systems Review of Systems  See HPI Physical Exam Triage Vital Signs ED Triage Vitals  Encounter Vitals Group     BP 06/22/24 0809 125/85     Girls Systolic BP  Percentile --      Girls Diastolic BP Percentile --      Boys Systolic BP Percentile --      Boys Diastolic BP Percentile --      Pulse Rate 06/22/24 0809 91     Resp 06/22/24 0809 16     Temp 06/22/24 0809 98.1 F (36.7 C)     Temp src --      SpO2 06/22/24 0809 98 %     Weight --      Height --      Head Circumference --      Peak Flow --      Pain Score 06/22/24 0813 3     Pain Loc --      Pain Education --      Exclude from Growth Chart --    No data found.  Updated Vital Signs BP 125/85   Pulse 91   Temp 98.1 F (36.7 C)   Resp 16   LMP 06/21/2024 (Exact Date)   SpO2 98%       Physical Exam Constitutional:      General: She is not in acute distress.  Appearance: She is well-developed.  HENT:     Head: Normocephalic and atraumatic.  Eyes:     Conjunctiva/sclera: Conjunctivae normal.     Pupils: Pupils are equal, round, and reactive to light.  Cardiovascular:     Rate and Rhythm: Normal rate.  Pulmonary:     Effort: Pulmonary effort is normal. No respiratory distress.  Abdominal:     General: There is no distension.     Palpations: Abdomen is soft.  Musculoskeletal:        General: Normal range of motion.     Cervical back: Normal range of motion.  Skin:    General: Skin is warm and dry.     Findings: Rash present.     Comments: Patches of vesicles on erythematous base.  On the right volar forearm there is soft tissue swelling and erythema  Neurological:     Mental Status: She is alert.      UC Treatments / Results  Labs (all labs ordered are listed, but only abnormal results are displayed) Labs Reviewed - No data to display  EKG   Radiology No results found.  Procedures Procedures (including critical care time)  Medications Ordered in UC Medications  methylPREDNISolone  acetate (DEPO-MEDROL ) injection 80 mg (80 mg Intramuscular Given 06/22/24 0827)    Initial Impression / Assessment and Plan / UC Course  I have reviewed the triage  vital signs and the nursing notes.  Pertinent labs & imaging results that were available during my care of the patient were reviewed by me and considered in my medical decision making (see chart for details).    Patient is given a shot of Depo-Medrol  Final Clinical Impressions(s) / UC Diagnoses   Final diagnoses:  Allergic dermatitis due to other chemical product     Discharge Instructions      Antihistamines will help with the itching You need to double the usual dose for it to help 2 zyrtec/claritin/allegra in the daytime Benadryl at night Try to stay cool   ED Prescriptions   None    PDMP not reviewed this encounter.   Maranda Jamee Jacob, MD 06/22/24 [phone]
